# Patient Record
Sex: Female | Born: 1935 | Race: Black or African American | Hispanic: No | Marital: Married | Smoking: Former smoker
Health system: Southern US, Community
[De-identification: ages and names within clinical notes are randomized; demographics above are authoritative.]

## PROBLEM LIST (undated history)

## (undated) DIAGNOSIS — H269 Unspecified cataract: Secondary | ICD-10-CM

## (undated) DIAGNOSIS — I10 Essential (primary) hypertension: Secondary | ICD-10-CM

## (undated) DIAGNOSIS — E079 Disorder of thyroid, unspecified: Secondary | ICD-10-CM

## (undated) DIAGNOSIS — R011 Cardiac murmur, unspecified: Secondary | ICD-10-CM

## (undated) DIAGNOSIS — N189 Chronic kidney disease, unspecified: Secondary | ICD-10-CM

## (undated) DIAGNOSIS — M1990 Unspecified osteoarthritis, unspecified site: Secondary | ICD-10-CM

## (undated) DIAGNOSIS — D649 Anemia, unspecified: Secondary | ICD-10-CM

## (undated) DIAGNOSIS — M810 Age-related osteoporosis without current pathological fracture: Secondary | ICD-10-CM

## (undated) HISTORY — DX: Cardiac murmur, unspecified: R01.1

## (undated) HISTORY — PX: REPLACEMENT TOTAL KNEE BILATERAL: SUR1225

## (undated) HISTORY — DX: Anemia, unspecified: D64.9

## (undated) HISTORY — DX: Unspecified cataract: H26.9

## (undated) HISTORY — PX: JOINT REPLACEMENT: SHX530

## (undated) HISTORY — DX: Age-related osteoporosis without current pathological fracture: M81.0

## (undated) HISTORY — PX: SPINE SURGERY: SHX786

## (undated) HISTORY — PX: BACK SURGERY: SHX140

## (undated) HISTORY — DX: Chronic kidney disease, unspecified: N18.9

## (undated) HISTORY — DX: Disorder of thyroid, unspecified: E07.9

## (undated) HISTORY — PX: EYE SURGERY: SHX253

---

## 2006-07-04 ENCOUNTER — Ambulatory Visit (HOSPITAL_COMMUNITY): Admission: RE | Admit: 2006-07-04 | Discharge: 2006-07-04 | Payer: Self-pay | Admitting: Family Medicine

## 2007-05-30 ENCOUNTER — Ambulatory Visit (HOSPITAL_COMMUNITY): Admission: RE | Admit: 2007-05-30 | Discharge: 2007-05-30 | Payer: Self-pay | Admitting: Family Medicine

## 2007-06-24 ENCOUNTER — Emergency Department (HOSPITAL_COMMUNITY): Admission: EM | Admit: 2007-06-24 | Discharge: 2007-06-24 | Payer: Self-pay | Admitting: Emergency Medicine

## 2007-08-06 ENCOUNTER — Encounter (HOSPITAL_COMMUNITY): Admission: RE | Admit: 2007-08-06 | Discharge: 2007-09-05 | Payer: Self-pay | Admitting: Orthopaedic Surgery

## 2007-09-24 ENCOUNTER — Inpatient Hospital Stay (HOSPITAL_COMMUNITY): Admission: RE | Admit: 2007-09-24 | Discharge: 2007-09-26 | Payer: Self-pay | Admitting: Neurosurgery

## 2007-12-16 ENCOUNTER — Encounter (HOSPITAL_COMMUNITY): Admission: RE | Admit: 2007-12-16 | Discharge: 2008-01-15 | Payer: Self-pay | Admitting: Neurosurgery

## 2008-02-01 ENCOUNTER — Emergency Department (HOSPITAL_COMMUNITY): Admission: EM | Admit: 2008-02-01 | Discharge: 2008-02-01 | Payer: Self-pay | Admitting: Emergency Medicine

## 2008-06-02 ENCOUNTER — Ambulatory Visit (HOSPITAL_COMMUNITY): Admission: RE | Admit: 2008-06-02 | Discharge: 2008-06-02 | Payer: Self-pay | Admitting: Internal Medicine

## 2008-06-02 ENCOUNTER — Encounter: Payer: Self-pay | Admitting: Orthopedic Surgery

## 2008-06-09 ENCOUNTER — Ambulatory Visit: Payer: Self-pay | Admitting: Orthopedic Surgery

## 2008-06-09 DIAGNOSIS — M7580 Other shoulder lesions, unspecified shoulder: Secondary | ICD-10-CM

## 2008-06-09 DIAGNOSIS — M25519 Pain in unspecified shoulder: Secondary | ICD-10-CM | POA: Insufficient documentation

## 2008-06-17 ENCOUNTER — Encounter: Payer: Self-pay | Admitting: Orthopedic Surgery

## 2008-07-22 ENCOUNTER — Ambulatory Visit: Payer: Self-pay | Admitting: Orthopedic Surgery

## 2008-09-16 ENCOUNTER — Ambulatory Visit: Payer: Self-pay | Admitting: Orthopedic Surgery

## 2008-12-30 ENCOUNTER — Emergency Department (HOSPITAL_COMMUNITY): Admission: EM | Admit: 2008-12-30 | Discharge: 2008-12-30 | Payer: Self-pay | Admitting: Emergency Medicine

## 2009-03-09 ENCOUNTER — Encounter: Payer: Self-pay | Admitting: Internal Medicine

## 2009-03-11 ENCOUNTER — Encounter: Payer: Self-pay | Admitting: Internal Medicine

## 2009-03-17 ENCOUNTER — Encounter: Payer: Self-pay | Admitting: Gastroenterology

## 2009-03-18 ENCOUNTER — Encounter: Payer: Self-pay | Admitting: Gastroenterology

## 2009-03-22 ENCOUNTER — Ambulatory Visit (HOSPITAL_COMMUNITY): Admission: RE | Admit: 2009-03-22 | Discharge: 2009-03-22 | Payer: Self-pay | Admitting: Internal Medicine

## 2009-03-22 ENCOUNTER — Ambulatory Visit: Payer: Self-pay | Admitting: Internal Medicine

## 2009-03-23 ENCOUNTER — Encounter: Payer: Self-pay | Admitting: Internal Medicine

## 2009-04-19 ENCOUNTER — Encounter
Admission: RE | Admit: 2009-04-19 | Discharge: 2009-06-16 | Payer: Self-pay | Admitting: Physical Medicine & Rehabilitation

## 2009-05-25 ENCOUNTER — Ambulatory Visit (HOSPITAL_COMMUNITY): Admission: RE | Admit: 2009-05-25 | Discharge: 2009-05-25 | Payer: Self-pay | Admitting: Ophthalmology

## 2009-05-28 ENCOUNTER — Ambulatory Visit: Payer: Self-pay | Admitting: Physical Medicine & Rehabilitation

## 2009-06-29 ENCOUNTER — Ambulatory Visit (HOSPITAL_COMMUNITY): Admission: RE | Admit: 2009-06-29 | Discharge: 2009-06-29 | Payer: Self-pay | Admitting: Ophthalmology

## 2009-07-02 ENCOUNTER — Encounter
Admission: RE | Admit: 2009-07-02 | Discharge: 2009-09-30 | Payer: Self-pay | Admitting: Physical Medicine & Rehabilitation

## 2009-07-02 ENCOUNTER — Ambulatory Visit: Payer: Self-pay | Admitting: Physical Medicine & Rehabilitation

## 2009-07-29 ENCOUNTER — Ambulatory Visit: Payer: Self-pay | Admitting: Physical Medicine & Rehabilitation

## 2009-08-26 ENCOUNTER — Ambulatory Visit: Payer: Self-pay | Admitting: Physical Medicine & Rehabilitation

## 2009-12-23 ENCOUNTER — Emergency Department (HOSPITAL_COMMUNITY): Admission: EM | Admit: 2009-12-23 | Discharge: 2009-12-23 | Payer: Self-pay | Admitting: Emergency Medicine

## 2010-01-26 ENCOUNTER — Encounter
Admission: RE | Admit: 2010-01-26 | Discharge: 2010-04-05 | Payer: Self-pay | Source: Home / Self Care | Attending: Physical Medicine & Rehabilitation | Admitting: Physical Medicine & Rehabilitation

## 2010-04-07 NOTE — Letter (Signed)
Summary: Internal Other Domingo Dimes  Internal Other Domingo Dimes   Imported By: Cloria Spring LPN 16/12/9602 54:09:81  _____________________________________________________________________  External Attachment:    Type:   Image     Comment:   External Document

## 2010-04-07 NOTE — Letter (Signed)
Summary: INS AUT FORM  INS AUT FORM   Imported By: Ave Filter 03/23/2009 12:43:27  _____________________________________________________________________  External Attachment:    Type:   Image     Comment:   External Document

## 2010-04-07 NOTE — Letter (Signed)
Summary: Internal Other Domingo Dimes  Internal Other Domingo Dimes   Imported By: Cloria Spring LPN 04/54/0981 19:14:78  _____________________________________________________________________  External Attachment:    Type:   Image     Comment:   External Document

## 2010-04-07 NOTE — Medication Information (Signed)
Summary: Tax adviser   Imported By: Diana Eves 03/17/2009 10:26:40  _____________________________________________________________________  External Attachment:    Type:   Image     Comment:   External Document  Appended Document: RX Folder peg 4000cc     Please call in Peg [phone] with flavor pack solution, 4000cc, Orfs. Use as directed.  Appended Document: RX Folder Called to [place] @ [facility].

## 2010-04-07 NOTE — Medication Information (Signed)
Summary: Tax adviser   Imported By: Ricard Dillon 03/18/2009 11:56:03  _____________________________________________________________________  External Attachment:    Type:   Image     Comment:   External Document  Appended Document: RX Folder duplicate

## 2010-05-29 LAB — BASIC METABOLIC PANEL
BUN: 16 mg/dL (ref 6–23)
Chloride: 106 mEq/L (ref 96–112)
GFR calc non Af Amer: 56 mL/min — ABNORMAL LOW (ref >60)

## 2010-06-09 LAB — URINALYSIS, ROUTINE W REFLEX MICROSCOPIC
Hgb urine dipstick: NEGATIVE
Nitrite: NEGATIVE
Specific Gravity, Urine: 1.015 (ref 1.005–1.030)
pH: 6 (ref 5.0–8.0)

## 2010-06-09 LAB — URINE CULTURE

## 2010-06-09 LAB — DIFFERENTIAL
Basophils Absolute: 0 10*3/uL (ref 0.0–0.1)
Lymphs Abs: 1.4 10*3/uL (ref 0.7–4.0)
Monocytes Relative: 7 % (ref 3–12)
Neutro Abs: 3 10*3/uL (ref 1.7–7.7)
Neutrophils Relative %: 60 % (ref 43–77)

## 2010-06-09 LAB — CBC
HCT: 33.6 % — ABNORMAL LOW (ref 36.0–46.0)
MCHC: 34.2 g/dL (ref 30.0–36.0)
RBC: 3.71 MIL/uL — ABNORMAL LOW (ref 3.87–5.11)
WBC: 5 10*3/uL (ref 4.0–10.5)

## 2010-06-09 LAB — BASIC METABOLIC PANEL
BUN: 8 mg/dL (ref 6–23)
Calcium: 9.9 mg/dL (ref 8.4–10.5)
Chloride: 102 mEq/L (ref 96–112)
GFR calc non Af Amer: >60 mL/min (ref >60)
Glucose, Bld: 110 mg/dL — ABNORMAL HIGH (ref 70–99)

## 2010-06-09 LAB — POCT CARDIAC MARKERS
Myoglobin, poc: 88.3 ng/mL (ref 12–200)
Troponin i, poc: <0.05 ng/mL (ref 0.00–0.09)

## 2010-07-19 NOTE — Op Note (Signed)
Jeanette Brady, Jeanette Brady                ACCOUNT NO.:  [id number]   MEDICAL RECORD NO.:  [id number]          PATIENT TYPE:  INP   LOCATION:  3023                         FACILITY:  [facility]   PHYSICIAN:  Hilda Lias, M.D.   DATE OF BIRTH:  1930/02/02   DATE OF PROCEDURE:  09/24/2007  DATE OF DISCHARGE:                               OPERATIVE REPORT   PREOPERATIVE DIAGNOSIS:  L4-L5 herniated disk with intraforaminal  compromise stenosis at L3-L4 and mild L5-S1.   POSTOPERATIVE DIAGNOSES:  L4-5 herniated disk with intraforaminal  compromise stenosis at L3-4 and mild L5-S1.   PROCEDURES:  1. Left L4 hemilaminectomy.  2. Left L4-L5 diskectomy.  3. Decompression of the L4-L5 nerve root.  4. Microscope.   SURGICAL PROCEDURES:  Hilda Lias, MD.   ASSISTANT:  Clydene Fake, MD   [name] is a lady who came to my office complaining of back pain  radiating to the left leg.  She has a severe degenerative disk disease  with spondylolisthesis with a herniated disk at the level of L4-L5 to  the left.  She has failed with conservative treatment.  This morning  before surgery, she was complaining of quite a bit of pain in the left  acromioclavicular joint.  There was no evidence of infection.  After the  surgery, I would be doing infiltration on the left shoulder.   DESCRIPTION OF PROCEDURE:  The patient was taken to the OR and she was  positioned in a prone manner.  The back was cleaned with DuraPrep and  midline incision.  After we did a x-ray which showed that we were at the  level of L4.  The incision was carried out through the skin,  subcutaneous tissue to a thick adipose tissue down to the spinous  process.  We found interlaminar space between L4-L5.  There was almost  no space whatsoever.  Because of that, we went ahead and we did a  hemilaminectomy of L4.  A thick, yellow ligament was also excised.  We  found the thecal sac and retraction was made.  Indeed, there was a  herniated disk mostly going into the foramen compromising the L4 nerve  root.  Incision was made and two large fragments were removed.  Then  foraminotomy was accomplished and at the end, we have a good  decompression of the L4 and L5 nerve root.  Fentanyl and Depo-Medrol  were left in the epidural space and the wound was closed with Vicryl and  Steri-Strips.  Immediately after surgery, I cleaned the left shoulder  with DuraPrep.  Using 4 mg of Kenalog, infiltration of the  acromioclavicular joint was done to infiltrate the area.  The patient is  going to go to the recovery room and I will follow her.          ______________________________  Hilda Lias, M.D.    EB/MEDQ  D:  09/24/2007  T:  09/25/2007  Job:  [id number]

## 2010-12-02 LAB — CBC
MCHC: 32.8
Platelets: 232
RDW: 15.1
WBC: 6.2

## 2010-12-02 LAB — BASIC METABOLIC PANEL
BUN: 11
CO2: 23
Chloride: 103
Creatinine, Ser: 0.91
GFR calc Af Amer: >60
GFR calc non Af Amer: >60
Potassium: 3.6

## 2013-09-08 ENCOUNTER — Emergency Department (HOSPITAL_COMMUNITY)
Admission: EM | Admit: 2013-09-08 | Discharge: 2013-09-08 | Disposition: A | Discharge status: Home / Self Care | Payer: Medicare HMO | Attending: Emergency Medicine | Admitting: Emergency Medicine

## 2013-09-08 ENCOUNTER — Emergency Department (HOSPITAL_COMMUNITY): Payer: Medicare HMO

## 2013-09-08 ENCOUNTER — Encounter (HOSPITAL_COMMUNITY): Payer: Self-pay | Admitting: Emergency Medicine

## 2013-09-08 DIAGNOSIS — M79641 Pain in right hand: Secondary | ICD-10-CM

## 2013-09-08 DIAGNOSIS — M1990 Unspecified osteoarthritis, unspecified site: Secondary | ICD-10-CM

## 2013-09-08 DIAGNOSIS — Z87891 Personal history of nicotine dependence: Secondary | ICD-10-CM | POA: Insufficient documentation

## 2013-09-08 DIAGNOSIS — M19049 Primary osteoarthritis, unspecified hand: Secondary | ICD-10-CM | POA: Insufficient documentation

## 2013-09-08 HISTORY — DX: Unspecified osteoarthritis, unspecified site: M19.90

## 2013-09-08 MED ORDER — PREDNISONE 10 MG PO TABS
10.0000 mg | ORAL_TABLET | Freq: Once | ORAL | Status: AC
Start: 2013-09-08 — End: 2013-09-08
  Administered 2013-09-08: 10 mg via ORAL
  Filled 2013-09-08: qty 1

## 2013-09-08 MED ORDER — PREDNISONE 10 MG PO TABS: 10.0000 mg | ORAL_TABLET | Freq: Every day | ORAL | Status: DC

## 2013-09-08 NOTE — ED Notes (Signed)
Pt returned from xray with tech

## 2013-09-08 NOTE — ED Provider Notes (Signed)
CSN: [id number]     Arrival date & time 09/08/13  4098 History   First MD Initiated Contact with Patient 09/08/13 0802     Chief Complaint  Patient presents with  . Hand Pain     (Consider location/radiation/quality/duration/timing/severity/associated sxs/prior Treatment) Patient is a 80 y.o. female presenting with hand pain. The history is provided by the patient.  Hand Pain This is a new problem. The current episode started 1 to 4 weeks ago. The problem occurs constantly. The problem has been gradually worsening. She has tried nothing for the symptoms.   Jeanette Brady is a 80 y.o. female who presents to the ED with right hand pain that started 2 weeks ago. She is right hand dominant and has trouble eating due to the pain. The pain is severe.  She has occasional pain that starts in the right shoulder that radiates to the hand causing severe pain, but even when the shoulder pain is not there she has severe pain in the joints of her fingers. She also has pain that comes and goes in the left lower leg when ambulating. PMH significant for arthritis. She has had bilateral knee replacements and back surgery. She has tried aleve for pain.   Past Medical History  Diagnosis Date  . Arthritis    Past Surgical History  Procedure Laterality Date  . Back surgery    . Replacement total knee bilateral     History reviewed. No pertinent family history. History  Substance Use Topics  . Smoking status: Former Research scientist (life sciences)  . Smokeless tobacco: Not on file  . Alcohol Use: No   OB History   Grav Para Term Preterm Abortions TAB SAB Ect Mult Living                 Review of Systems Negative except as stated in HPI   Allergies  Review of patient's allergies indicates not on file.  Home Medications   Prior to Admission medications   Not on File   BP 177/58  Pulse 78  Temp(Src) 97.7 F (36.5 C) (Oral)  Resp 18  Ht 5\' 5"  (1.651 m)  Wt 156 lb (70.761 kg)  BMI 25.96 kg/m2  SpO2 98% Physical  Exam  Nursing note and vitals reviewed. Constitutional: She is oriented to person, place, and time. She appears well-developed and well-nourished. No distress.  HENT:  Head: Normocephalic.  Eyes: EOM are normal.  Neck: Neck supple.  Cardiovascular: Normal rate.   Pulmonary/Chest: Effort normal.  Musculoskeletal:       Right hand: She exhibits tenderness and bony tenderness. She exhibits normal capillary refill, no deformity and no laceration. Normal sensation noted. Decreased strength noted.  Tender over joints of fingers. Decreased grip due to pain. Radial pulse strong, adequate circulation. Good touch sensation. Full range of motion of right shoulder without difficulty or pain.   Neurological: She is alert and oriented to person, place, and time. No cranial nerve deficit.  Skin: Skin is warm and dry.  Psychiatric: She has a normal mood and affect. Her behavior is normal.   Dg Hand Complete Right  09/08/2013   CLINICAL DATA:  Diffuse right hand pain for 2-3 weeks, worsening over the past week.  EXAM: RIGHT HAND - COMPLETE 3+ VIEW  COMPARISON:  None.  FINDINGS: There is no acute bony or joint abnormality. Marked joint space narrowing and osteophytosis are seen about the first and second MCP joints. There is also some joint space narrowing and osteophytosis about the first  CMC joint. Soft tissue structures are unremarkable.  IMPRESSION: No acute finding.  Advanced degenerative disease first and second MCP joints.   Electronically Signed   By: Inge Rise M.D.   On: 09/08/2013 08:34    ED Course: Dr. Lacinda Axon in to see the patient  Procedures  MDM  80 y.o. female with right hand pain x 2 weeks with history of arthritis. I have reviewed this patient's vital signs, nurses notes, appropriate imaging and discussed findings with the patient and plan of care. She voices understanding. She will follow up with her PCP or return here as needed.     Medication List         predniSONE 10 MG tablet   Commonly known as:  DELTASONE  Take 1 tablet (10 mg total) by mouth daily with breakfast.            Ashley Murrain, NP 09/08/13 [phone]

## 2013-09-08 NOTE — ED Notes (Signed)
Pt a&o, nad, right wrist splint in place. Pt states "that already makes my wrist feel better". Instructions on wrist splint given. Sensation intact, positive pulses. F/u reviewed with patient.

## 2013-09-08 NOTE — ED Notes (Signed)
Pt reports right hand pain, left leg pain for several days. Pt denies any known injury. No obvious deformities noted. nad noted.

## 2013-09-08 NOTE — Discharge Instructions (Signed)
Take tylenol and the medication we give you. Follow up with Dr. Legrand Rams. Wear the splint for comfort. Return as needed.

## 2013-09-09 NOTE — ED Provider Notes (Signed)
Medical screening examination/treatment/procedure(s) were conducted as a shared visit with non-physician practitioner(s) and myself.  I personally evaluated the patient during the encounter.   EKG Interpretation None     No acute neurovascular problem.  Rx for arthritic pain  Nat Christen, MD 09/09/13 [phone]

## 2014-01-09 ENCOUNTER — Emergency Department (HOSPITAL_COMMUNITY)
Admission: EM | Admit: 2014-01-09 | Discharge: 2014-01-09 | Disposition: A | Discharge status: Home / Self Care | Payer: Medicare HMO | Attending: Emergency Medicine | Admitting: Emergency Medicine

## 2014-01-09 ENCOUNTER — Encounter (HOSPITAL_COMMUNITY): Payer: Self-pay | Admitting: Emergency Medicine

## 2014-01-09 ENCOUNTER — Emergency Department (HOSPITAL_COMMUNITY): Payer: Medicare HMO

## 2014-01-09 DIAGNOSIS — M6281 Muscle weakness (generalized): Secondary | ICD-10-CM | POA: Insufficient documentation

## 2014-01-09 DIAGNOSIS — Z79899 Other long term (current) drug therapy: Secondary | ICD-10-CM | POA: Insufficient documentation

## 2014-01-09 DIAGNOSIS — Y998 Other external cause status: Secondary | ICD-10-CM | POA: Insufficient documentation

## 2014-01-09 DIAGNOSIS — W01190A Fall on same level from slipping, tripping and stumbling with subsequent striking against furniture, initial encounter: Secondary | ICD-10-CM | POA: Insufficient documentation

## 2014-01-09 DIAGNOSIS — Z7952 Long term (current) use of systemic steroids: Secondary | ICD-10-CM | POA: Insufficient documentation

## 2014-01-09 DIAGNOSIS — S7002XA Contusion of left hip, initial encounter: Secondary | ICD-10-CM | POA: Diagnosis not present

## 2014-01-09 DIAGNOSIS — Y92000 Kitchen of unspecified non-institutional (private) residence as  the place of occurrence of the external cause: Secondary | ICD-10-CM | POA: Insufficient documentation

## 2014-01-09 DIAGNOSIS — Z7982 Long term (current) use of aspirin: Secondary | ICD-10-CM | POA: Diagnosis not present

## 2014-01-09 DIAGNOSIS — M1990 Unspecified osteoarthritis, unspecified site: Secondary | ICD-10-CM | POA: Insufficient documentation

## 2014-01-09 DIAGNOSIS — Y9389 Activity, other specified: Secondary | ICD-10-CM | POA: Diagnosis not present

## 2014-01-09 DIAGNOSIS — I10 Essential (primary) hypertension: Secondary | ICD-10-CM | POA: Diagnosis not present

## 2014-01-09 DIAGNOSIS — S79912A Unspecified injury of left hip, initial encounter: Secondary | ICD-10-CM | POA: Diagnosis present

## 2014-01-09 DIAGNOSIS — Z87891 Personal history of nicotine dependence: Secondary | ICD-10-CM | POA: Diagnosis not present

## 2014-01-09 DIAGNOSIS — Z96653 Presence of artificial knee joint, bilateral: Secondary | ICD-10-CM | POA: Diagnosis not present

## 2014-01-09 DIAGNOSIS — W19XXXA Unspecified fall, initial encounter: Secondary | ICD-10-CM

## 2014-01-09 HISTORY — DX: Essential (primary) hypertension: I10

## 2014-01-09 LAB — URINALYSIS, ROUTINE W REFLEX MICROSCOPIC
Bilirubin Urine: NEGATIVE
GLUCOSE, UA: NEGATIVE mg/dL
Hgb urine dipstick: NEGATIVE
Ketones, ur: NEGATIVE mg/dL
Leukocytes, UA: NEGATIVE
NITRITE: NEGATIVE
Protein, ur: NEGATIVE mg/dL
UROBILINOGEN UA: 0.2 mg/dL (ref 0.0–1.0)
pH: 6 (ref 5.0–8.0)

## 2014-01-09 NOTE — ED Notes (Signed)
PT stated she fell x3 days ago in her kitchen and hit onto her left hip. PT c/o hip pain, generalized weakness and difficulty walking.

## 2014-01-09 NOTE — ED Notes (Signed)
Patient ambulated in hallway independently with steady gait using cane.

## 2014-01-09 NOTE — ED Provider Notes (Signed)
Patient reports she fell 2 days ago at home injuring her left hip. No other injury. Pain is worse with walking improved with remaining still. She denies generalized weakness she does report "my legs up and wobbly for the past 4 months." She walks with a cane.she has been ambulatory since the fall On exam she is alert no distress Glasgow Coma Score 15 HEENT exam conjunctiva pink pelvis is stable. Left lower extremity mild tenderness over hip. No pain on internal or external rotation of thigh. No deformity. DP pulse 2+. All other extremities without contusion abrasion or tenderness neurovascular intact. Pretest clinical suspicion for fracture is low  Orlie Dakin, MD 01/09/14 [phone]

## 2014-01-15 NOTE — ED Provider Notes (Signed)
CSN: [id number]     Arrival date & time 01/09/14  0820 History   First MD Initiated Contact with Patient 01/09/14 0857     Chief Complaint  Patient presents with  . Hip Pain     (Consider location/radiation/quality/duration/timing/severity/associated sxs/prior Treatment) HPI   Jeanette Brady is a 80 y.o. female who presents to the Emergency Department complaining of left hip pain after falling against her kitchen table.  She reports striking her left hip. She c/o pain with walking.  She has been taking ibuprofen with significant relief.  She denies head injury, neck or back pain, LOC, incontinence of bladder or bowel, saddle anesthesia's, vomiting or fever.  She also reports generalized weakness of both lower extremities, but states this is a chronic problem and she has been walking with a cane for some time.      Past Medical History  Diagnosis Date  . Arthritis   . Hypertension    Past Surgical History  Procedure Laterality Date  . Back surgery    . Replacement total knee bilateral     No family history on file. History  Substance Use Topics  . Smoking status: Former Research scientist (life sciences)  . Smokeless tobacco: Current User     Comment: snuff  . Alcohol Use: No   OB History    No data available     Review of Systems  Constitutional: Negative for fever and chills.  Respiratory: Negative for shortness of breath.   Cardiovascular: Negative for chest pain.  Gastrointestinal: Negative for nausea, vomiting and abdominal pain.  Genitourinary: Negative for dysuria, hematuria and difficulty urinating.  Musculoskeletal: Positive for arthralgias. Negative for back pain, joint swelling and neck pain.  Skin: Negative for color change and wound.  Neurological: Negative for dizziness, syncope, light-headedness, numbness and headaches.       Generalized weakness of both legs  All other systems reviewed and are negative.     Allergies  Review of patient's allergies indicates no known  allergies.  Home Medications   Prior to Admission medications   Medication Sig Start Date End Date Taking? Authorizing Provider  aspirin EC 81 MG tablet Take 81 mg by mouth daily.   Yes Historical Provider, MD  Aspirin-Acetaminophen-Caffeine (GOODY HEADACHE PO) Take 1 Package by mouth daily as needed (headache).   Yes Historical Provider, MD  gabapentin (NEURONTIN) 300 MG capsule Take 1 capsule by mouth 3 (three) times daily. 12/09/13  Yes Historical Provider, MD  ibuprofen (ADVIL,MOTRIN) 200 MG tablet Take 400 mg by mouth every 6 (six) hours as needed for moderate pain.   Yes Historical Provider, MD  losartan-hydrochlorothiazide (HYZAAR) 100-12.5 MG per tablet Take 1 tablet by mouth daily. 12/08/13  Yes Historical Provider, MD  SYNTHROID 100 MCG tablet Take 1 tablet by mouth daily. 11/29/13  Yes Historical Provider, MD  predniSONE (DELTASONE) 10 MG tablet Take 1 tablet (10 mg total) by mouth daily with breakfast. Patient not taking: Reported on 01/09/2014 09/08/13   Ashley Murrain, NP   BP 181/61 mmHg  Pulse 76  Temp(Src) 99 F (37.2 C) (Oral)  Resp 16  Ht 5\' 2"  (1.575 m)  Wt 156 lb (70.761 kg)  BMI 28.53 kg/m2  SpO2 100% Physical Exam  Constitutional: She is oriented to person, place, and time. She appears well-developed and well-nourished. No distress.  HENT:  Head: Normocephalic and atraumatic.  Neck: Normal range of motion. Neck supple.  Cardiovascular: Normal rate, regular rhythm, normal heart sounds and intact distal pulses.   No  murmur heard. Pulmonary/Chest: Effort normal and breath sounds normal. No respiratory distress.  Abdominal: Soft. She exhibits no distension. There is no tenderness. There is no rebound and no guarding.  Musculoskeletal: She exhibits tenderness. She exhibits no edema.       Lumbar back: She exhibits tenderness and pain. She exhibits normal range of motion, no swelling, no deformity, no laceration and normal pulse.  ttp of the left lateral and posterior hip.   No spinal tenderness.  Pelvis is NT.  No bony deformity, edema bruising or abrasion.  DP pulses are brisk and symmetrical.  Distal sensation intact.  Hip Flexors/Extensors are intact.    Neurological: She is alert and oriented to person, place, and time. She has normal strength. No sensory deficit. She exhibits normal muscle tone. Coordination and gait normal.  Reflex Scores:      Patellar reflexes are 2+ on the right side and 2+ on the left side.      Achilles reflexes are 2+ on the right side and 2+ on the left side. Skin: Skin is warm and dry. No rash noted.  Psychiatric: She has a normal mood and affect. Her behavior is normal. Thought content normal.  Nursing note and vitals reviewed.   ED Course  Procedures (including critical care time) Labs Review Labs Reviewed  URINALYSIS, ROUTINE W REFLEX MICROSCOPIC - Abnormal; Notable for the following:    Specific Gravity, Urine <1.005 (*)    All other components within normal limits    Imaging Review Dg Hip Complete Left  01/09/2014   CLINICAL DATA:  Hip pain  EXAM: LEFT HIP - COMPLETE 2+ VIEW  COMPARISON:  06/24/2007  FINDINGS: Negative for fracture or AVN. Hip joint is normal. No change from the prior study. Arterial calcification is noted.  IMPRESSION: Negative.   Electronically Signed   By: Franchot Gallo M.D.   On: 01/09/2014 10:38     EKG Interpretation None      MDM   Final diagnoses:  Fall  Contusion, hip, left, initial encounter    Pt is well appearing, ambulates with a cane at baseline.  Has tenderness of the left hip without bony deformity.  Clinical suspicion for fx is low, XR neg.  No concerning sx's for septic joint.  Pain controlled with ibuprofen.  Pt agrees to close f/u with her PMD or to return here if the pain worsens.    Pt also seen by Dr. Winfred Leeds and care plan discussed.      Donterius Filley L. Ammie Ferrier 01/15/14 2001  Orlie Dakin, MD 01/17/14 2831

## 2014-01-25 ENCOUNTER — Emergency Department (HOSPITAL_COMMUNITY)
Admission: EM | Admit: 2014-01-25 | Discharge: 2014-01-25 | Disposition: A | Discharge status: Home / Self Care | Payer: Medicare HMO | Attending: Emergency Medicine | Admitting: Emergency Medicine

## 2014-01-25 ENCOUNTER — Encounter (HOSPITAL_COMMUNITY): Payer: Self-pay | Admitting: Emergency Medicine

## 2014-01-25 DIAGNOSIS — F419 Anxiety disorder, unspecified: Secondary | ICD-10-CM | POA: Diagnosis not present

## 2014-01-25 DIAGNOSIS — R251 Tremor, unspecified: Secondary | ICD-10-CM | POA: Diagnosis present

## 2014-01-25 DIAGNOSIS — Z87891 Personal history of nicotine dependence: Secondary | ICD-10-CM | POA: Diagnosis not present

## 2014-01-25 DIAGNOSIS — M1990 Unspecified osteoarthritis, unspecified site: Secondary | ICD-10-CM | POA: Diagnosis not present

## 2014-01-25 DIAGNOSIS — R531 Weakness: Secondary | ICD-10-CM | POA: Insufficient documentation

## 2014-01-25 DIAGNOSIS — Z79899 Other long term (current) drug therapy: Secondary | ICD-10-CM | POA: Diagnosis not present

## 2014-01-25 DIAGNOSIS — I10 Essential (primary) hypertension: Secondary | ICD-10-CM | POA: Insufficient documentation

## 2014-01-25 DIAGNOSIS — E039 Hypothyroidism, unspecified: Secondary | ICD-10-CM | POA: Insufficient documentation

## 2014-01-25 DIAGNOSIS — Z7982 Long term (current) use of aspirin: Secondary | ICD-10-CM | POA: Insufficient documentation

## 2014-01-25 LAB — CBC WITH DIFFERENTIAL/PLATELET
BASOS ABS: 0.1 10*3/uL (ref 0.0–0.1)
BASOS PCT: 1 % (ref 0–1)
EOS ABS: 0.2 10*3/uL (ref 0.0–0.7)
EOS PCT: 3 % (ref 0–5)
HEMATOCRIT: 32.1 % — AB (ref 36.0–46.0)
Hemoglobin: 10.8 g/dL — ABNORMAL LOW (ref 12.0–15.0)
Lymphocytes Relative: 20 % (ref 12–46)
Lymphs Abs: 1.7 10*3/uL (ref 0.7–4.0)
MCH: 29.7 pg (ref 26.0–34.0)
MCHC: 33.6 g/dL (ref 30.0–36.0)
MCV: 88.2 fL (ref 78.0–100.0)
MONO ABS: 0.6 10*3/uL (ref 0.1–1.0)
Monocytes Relative: 8 % (ref 3–12)
Neutro Abs: 5.6 10*3/uL (ref 1.7–7.7)
Neutrophils Relative %: 69 % (ref 43–77)
Platelets: 350 10*3/uL (ref 150–400)
RBC: 3.64 MIL/uL — ABNORMAL LOW (ref 3.87–5.11)
RDW: 12.5 % (ref 11.5–15.5)
WBC: 8.1 10*3/uL (ref 4.0–10.5)

## 2014-01-25 LAB — URINALYSIS, ROUTINE W REFLEX MICROSCOPIC
Bilirubin Urine: NEGATIVE
GLUCOSE, UA: NEGATIVE mg/dL
HGB URINE DIPSTICK: NEGATIVE
KETONES UR: NEGATIVE mg/dL
Nitrite: NEGATIVE
PH: 6 (ref 5.0–8.0)
Protein, ur: NEGATIVE mg/dL
Specific Gravity, Urine: 1.02 (ref 1.005–1.030)
Urobilinogen, UA: 0.2 mg/dL (ref 0.0–1.0)

## 2014-01-25 LAB — URINE MICROSCOPIC-ADD ON

## 2014-01-25 LAB — COMPREHENSIVE METABOLIC PANEL
ALBUMIN: 3.1 g/dL — AB (ref 3.5–5.2)
ALK PHOS: 56 U/L (ref 39–117)
ALT: 5 U/L (ref 0–35)
ANION GAP: 12 (ref 5–15)
AST: 10 U/L (ref 0–37)
BILIRUBIN TOTAL: 0.2 mg/dL — AB (ref 0.3–1.2)
BUN: 20 mg/dL (ref 6–23)
CHLORIDE: 98 meq/L (ref 96–112)
CO2: 24 mEq/L (ref 19–32)
Calcium: 9.8 mg/dL (ref 8.4–10.5)
Creatinine, Ser: 1.2 mg/dL — ABNORMAL HIGH (ref 0.50–1.10)
GFR calc Af Amer: 47 mL/min — ABNORMAL LOW (ref >90)
GFR calc non Af Amer: 41 mL/min — ABNORMAL LOW (ref >90)
Glucose, Bld: 108 mg/dL — ABNORMAL HIGH (ref 70–99)
POTASSIUM: 4.1 meq/L (ref 3.7–5.3)
Sodium: 134 mEq/L — ABNORMAL LOW (ref 137–147)
Total Protein: 6.5 g/dL (ref 6.0–8.3)

## 2014-01-25 LAB — CBG MONITORING, ED: Glucose-Capillary: 88 mg/dL (ref 70–99)

## 2014-01-25 LAB — TROPONIN I: Troponin I: <0.3 ng/mL (ref <0.30)

## 2014-01-25 NOTE — ED Notes (Signed)
EDP at bedside  

## 2014-01-25 NOTE — Discharge Instructions (Signed)
Follow-up with your primary Dr. in the next few days if symptoms do not resolve, and return to the ER if your symptoms worsen or change.   Fatigue Fatigue is a feeling of tiredness, lack of energy, lack of motivation, or feeling tired all the time. Having enough rest, good nutrition, and reducing stress will normally reduce fatigue. Consult your caregiver if it persists. The nature of your fatigue will help your caregiver to find out its cause. The treatment is based on the cause.  CAUSES  There are many causes for fatigue. Most of the time, fatigue can be traced to one or more of your habits or routines. Most causes fit into one or more of three general areas. They are: Lifestyle problems  Sleep disturbances.  Overwork.  Physical exertion.  Unhealthy habits.  Poor eating habits or eating disorders.  Alcohol and/or drug use .  Lack of proper nutrition (malnutrition). Psychological problems  Stress and/or anxiety problems.  Depression.  Grief.  Boredom. Medical Problems or Conditions  Anemia.  Pregnancy.  Thyroid gland problems.  Recovery from major surgery.  Continuous pain.  Emphysema or asthma that is not well controlled  Allergic conditions.  Diabetes.  Infections (such as mononucleosis).  Obesity.  Sleep disorders, such as sleep apnea.  Heart failure or other heart-related problems.  Cancer.  Kidney disease.  Liver disease.  Effects of certain medicines such as antihistamines, cough and cold remedies, prescription pain medicines, heart and blood pressure medicines, drugs used for treatment of cancer, and some antidepressants. SYMPTOMS  The symptoms of fatigue include:   Lack of energy.  Lack of drive (motivation).  Drowsiness.  Feeling of indifference to the surroundings. DIAGNOSIS  The details of how you feel help guide your caregiver in finding out what is causing the fatigue. You will be asked about your present and past health  condition. It is important to review all medicines that you take, including prescription and non-prescription items. A thorough exam will be done. You will be questioned about your feelings, habits, and normal lifestyle. Your caregiver may suggest blood tests, urine tests, or other tests to look for common medical causes of fatigue.  TREATMENT  Fatigue is treated by correcting the underlying cause. For example, if you have continuous pain or depression, treating these causes will improve how you feel. Similarly, adjusting the dose of certain medicines will help in reducing fatigue.  HOME CARE INSTRUCTIONS   Try to get the required amount of good sleep every night.  Eat a healthy and nutritious diet, and drink enough water throughout the day.  Practice ways of relaxing (including yoga or meditation).  Exercise regularly.  Make plans to change situations that cause stress. Act on those plans so that stresses decrease over time. Keep your work and personal routine reasonable.  Avoid street drugs and minimize use of alcohol.  Start taking a daily multivitamin after consulting your caregiver. SEEK MEDICAL CARE IF:   You have persistent tiredness, which cannot be accounted for.  You have fever.  You have unintentional weight loss.  You have headaches.  You have disturbed sleep throughout the night.  You are feeling sad.  You have constipation.  You have dry skin.  You have gained weight.  You are taking any new or different medicines that you suspect are causing fatigue.  You are unable to sleep at night.  You develop any unusual swelling of your legs or other parts of your body. SEEK IMMEDIATE MEDICAL CARE IF:  You are feeling confused.  Your vision is blurred.  You feel faint or pass out.  You develop severe headache.  You develop severe abdominal, pelvic, or back pain.  You develop chest pain, shortness of breath, or an irregular or fast heartbeat.  You are  unable to pass a normal amount of urine.  You develop abnormal bleeding such as bleeding from the rectum or you vomit blood.  You have thoughts about harming yourself or committing suicide.  You are worried that you might harm someone else. MAKE SURE YOU:   Understand these instructions.  Will watch your condition.  Will get help right away if you are not doing well or get worse. Document Released: 12/18/2006 Document Revised: 05/15/2011 Document Reviewed: 06/24/2013 [facility] Patient Information 2015 [place], [place]. This information is not intended to replace advice given to you by your health care provider. Make sure you discuss any questions you have with your health care provider.

## 2014-01-25 NOTE — ED Provider Notes (Signed)
CSN: [id number]     Arrival date & time 01/25/14  1539 History   First MD Initiated Contact with Patient 01/25/14 1556     Chief Complaint  Patient presents with  . Tremors  . Anxiety     (Consider location/radiation/quality/duration/timing/severity/associated sxs/prior Treatment) HPI Comments: Patient is an 80 year old female with past medical history of hypertension and hypothyroidism. She presents today with complaints of weakness and shakiness that started this morning. She states she felt a little nervous last night and her symptoms worsened when she woke this morning. She denies any other symptoms. She denies any recent illness.  Patient is a 80 y.o. female presenting with anxiety. The history is provided by the patient.  Anxiety This is a new problem. Episode onset: This morning. The problem occurs constantly. The problem has been gradually worsening. Pertinent negatives include no chest pain and no shortness of breath. Nothing aggravates the symptoms. She has tried nothing for the symptoms. The treatment provided no relief.    Past Medical History  Diagnosis Date  . Arthritis   . Hypertension    Past Surgical History  Procedure Laterality Date  . Back surgery    . Replacement total knee bilateral     Family History  Problem Relation Age of Onset  . Heart attack Mother    History  Substance Use Topics  . Smoking status: Former Smoker -- 0.05 packs/day for 20 years    Types: Cigarettes    Quit date: 03/06/1993  . Smokeless tobacco: Current User    Types: Snuff  . Alcohol Use: No   OB History    Gravida Para Term Preterm AB TAB SAB Ectopic Multiple Living   2 2 2       2      Review of Systems  Constitutional: Positive for fatigue.  Respiratory: Negative for shortness of breath.   Cardiovascular: Negative for chest pain.  All other systems reviewed and are negative.     Allergies  Review of patient's allergies indicates no known allergies.  Home  Medications   Prior to Admission medications   Medication Sig Start Date End Date Taking? Authorizing Provider  aspirin EC 81 MG tablet Take 81 mg by mouth daily.    Historical Provider, MD  Aspirin-Acetaminophen-Caffeine (GOODY HEADACHE PO) Take 1 Package by mouth daily as needed (headache).    Historical Provider, MD  gabapentin (NEURONTIN) 300 MG capsule Take 1 capsule by mouth 3 (three) times daily. 12/09/13   Historical Provider, MD  ibuprofen (ADVIL,MOTRIN) 200 MG tablet Take 400 mg by mouth every 6 (six) hours as needed for moderate pain.    Historical Provider, MD  losartan-hydrochlorothiazide (HYZAAR) 100-12.5 MG per tablet Take 1 tablet by mouth daily. 12/08/13   Historical Provider, MD  predniSONE (DELTASONE) 10 MG tablet Take 1 tablet (10 mg total) by mouth daily with breakfast. Patient not taking: Reported on 01/09/2014 09/08/13   Ashley Murrain, NP  SYNTHROID 100 MCG tablet Take 1 tablet by mouth daily. 11/29/13   Historical Provider, MD   BP 146/59 mmHg  Pulse 74  Temp(Src) 98.1 F (36.7 C) (Oral)  Resp 16  Ht 5\' 3"  (1.6 m)  Wt 151 lb (68.493 kg)  BMI 26.76 kg/m2  SpO2 100% Physical Exam  Constitutional: She is oriented to person, place, and time. She appears well-developed and well-nourished. No distress.  HENT:  Head: Normocephalic and atraumatic.  Eyes: EOM are normal. Pupils are equal, round, and reactive to light.  Neck: Normal range  of motion. Neck supple.  Cardiovascular: Normal rate and regular rhythm.  Exam reveals no gallop and no friction rub.   No murmur heard. Pulmonary/Chest: Effort normal and breath sounds normal. No respiratory distress. She has no wheezes.  Abdominal: Soft. Bowel sounds are normal. She exhibits no distension. There is no tenderness.  Musculoskeletal: Normal range of motion. She exhibits no edema.  Neurological: She is alert and oriented to person, place, and time. No cranial nerve deficit. She exhibits normal muscle tone. Coordination normal.   Skin: Skin is warm and dry. She is not diaphoretic.  Nursing note and vitals reviewed.   ED Course  Procedures (including critical care time) Labs Review Labs Reviewed  COMPREHENSIVE METABOLIC PANEL  CBC WITH DIFFERENTIAL  TROPONIN I  URINALYSIS, ROUTINE W REFLEX MICROSCOPIC  CBG MONITORING, ED    Imaging Review No results found.   EKG Interpretation   Date/Time:  Sunday January 25 2014 16:02:58 EST Ventricular Rate:  73 PR Interval:  162 QRS Duration: 83 QT Interval:  398 QTC Calculation: 439 R Axis:   30 Text Interpretation:  Sinus rhythm Confirmed by Beau Fanny  MD, Rachyl Wuebker ([id number])  on 01/25/2014 4:21:15 PM      MDM   Final diagnoses:  None    Patient is an 80 year old female who presents with complaints of weakness, anxiousness, and tremors she states that started this morning. Her physical examination is unremarkable and laboratory studies and urinalysis are all unremarkable. Her EKG reveals a sinus rhythm with no changes from prior studies. I have discussed the results of these tests with her and agree that she is appropriate for discharge. She understands to return if her symptoms worsen or change.    Veryl Speak, MD 01/25/14 [phone]

## 2014-01-25 NOTE — ED Notes (Signed)
Patient reports feeling "very anxious" with tremors today. When asked about chest pain, family states she was c/o chest pain yesterday. Denies any shortness of breath.

## 2014-03-20 ENCOUNTER — Ambulatory Visit (HOSPITAL_COMMUNITY)
Admission: RE | Admit: 2014-03-20 | Discharge: 2014-03-20 | Disposition: A | Discharge status: Home / Self Care | Payer: Commercial Managed Care - HMO | Source: Ambulatory Visit | Attending: Internal Medicine | Admitting: Internal Medicine

## 2014-03-20 ENCOUNTER — Other Ambulatory Visit (HOSPITAL_COMMUNITY): Payer: Self-pay | Admitting: Internal Medicine

## 2014-03-20 DIAGNOSIS — M25511 Pain in right shoulder: Secondary | ICD-10-CM | POA: Diagnosis present

## 2014-06-16 ENCOUNTER — Emergency Department (HOSPITAL_COMMUNITY)
Admission: EM | Admit: 2014-06-16 | Discharge: 2014-06-17 | Disposition: A | Discharge status: Home / Self Care | Payer: Commercial Managed Care - HMO | Attending: Emergency Medicine | Admitting: Emergency Medicine

## 2014-06-16 ENCOUNTER — Encounter (HOSPITAL_COMMUNITY): Payer: Self-pay

## 2014-06-16 DIAGNOSIS — Z87891 Personal history of nicotine dependence: Secondary | ICD-10-CM | POA: Diagnosis not present

## 2014-06-16 DIAGNOSIS — R2241 Localized swelling, mass and lump, right lower limb: Secondary | ICD-10-CM | POA: Insufficient documentation

## 2014-06-16 DIAGNOSIS — Z7982 Long term (current) use of aspirin: Secondary | ICD-10-CM | POA: Insufficient documentation

## 2014-06-16 DIAGNOSIS — I10 Essential (primary) hypertension: Secondary | ICD-10-CM | POA: Diagnosis not present

## 2014-06-16 DIAGNOSIS — M1990 Unspecified osteoarthritis, unspecified site: Secondary | ICD-10-CM | POA: Insufficient documentation

## 2014-06-16 DIAGNOSIS — Z79899 Other long term (current) drug therapy: Secondary | ICD-10-CM | POA: Insufficient documentation

## 2014-06-16 DIAGNOSIS — F419 Anxiety disorder, unspecified: Secondary | ICD-10-CM | POA: Diagnosis not present

## 2014-06-16 DIAGNOSIS — R112 Nausea with vomiting, unspecified: Secondary | ICD-10-CM | POA: Diagnosis not present

## 2014-06-16 LAB — COMPREHENSIVE METABOLIC PANEL
ALK PHOS: 40 U/L (ref 39–117)
ALT: 7 U/L (ref 0–35)
AST: 16 U/L (ref 0–37)
Albumin: 3.7 g/dL (ref 3.5–5.2)
Anion gap: 7 (ref 5–15)
BUN: 18 mg/dL (ref 6–23)
CALCIUM: 9.8 mg/dL (ref 8.4–10.5)
CO2: 26 mmol/L (ref 19–32)
CREATININE: 0.86 mg/dL (ref 0.50–1.10)
Chloride: 99 mmol/L (ref 96–112)
GFR calc Af Amer: 70 mL/min — ABNORMAL LOW (ref >90)
GFR, EST NON AFRICAN AMERICAN: 61 mL/min — AB (ref >90)
Glucose, Bld: 111 mg/dL — ABNORMAL HIGH (ref 70–99)
Potassium: 3 mmol/L — ABNORMAL LOW (ref 3.5–5.1)
Sodium: 132 mmol/L — ABNORMAL LOW (ref 135–145)
Total Bilirubin: 0.4 mg/dL (ref 0.3–1.2)
Total Protein: 7 g/dL (ref 6.0–8.3)

## 2014-06-16 LAB — CBC WITH DIFFERENTIAL/PLATELET
BASOS ABS: 0.1 10*3/uL (ref 0.0–0.1)
BASOS PCT: 1 % (ref 0–1)
EOS PCT: 1 % (ref 0–5)
Eosinophils Absolute: 0.1 10*3/uL (ref 0.0–0.7)
HCT: 33.7 % — ABNORMAL LOW (ref 36.0–46.0)
Hemoglobin: 11.1 g/dL — ABNORMAL LOW (ref 12.0–15.0)
Lymphocytes Relative: 20 % (ref 12–46)
Lymphs Abs: 1.1 10*3/uL (ref 0.7–4.0)
MCH: 28.7 pg (ref 26.0–34.0)
MCHC: 32.9 g/dL (ref 30.0–36.0)
MCV: 87.1 fL (ref 78.0–100.0)
Monocytes Absolute: 0.5 10*3/uL (ref 0.1–1.0)
Monocytes Relative: 8 % (ref 3–12)
Neutro Abs: 4.1 10*3/uL (ref 1.7–7.7)
Neutrophils Relative %: 70 % (ref 43–77)
PLATELETS: 349 10*3/uL (ref 150–400)
RBC: 3.87 MIL/uL (ref 3.87–5.11)
RDW: 13.1 % (ref 11.5–15.5)
WBC: 5.9 10*3/uL (ref 4.0–10.5)

## 2014-06-16 LAB — LIPASE, BLOOD: Lipase: 34 U/L (ref 11–59)

## 2014-06-16 LAB — URINALYSIS, ROUTINE W REFLEX MICROSCOPIC
BILIRUBIN URINE: NEGATIVE
GLUCOSE, UA: NEGATIVE mg/dL
Hgb urine dipstick: NEGATIVE
KETONES UR: NEGATIVE mg/dL
Leukocytes, UA: NEGATIVE
Nitrite: NEGATIVE
PROTEIN: NEGATIVE mg/dL
Specific Gravity, Urine: 1.025 (ref 1.005–1.030)
UROBILINOGEN UA: 0.2 mg/dL (ref 0.0–1.0)
pH: 5.5 (ref 5.0–8.0)

## 2014-06-16 LAB — TROPONIN I

## 2014-06-16 MED ORDER — POTASSIUM CHLORIDE CRYS ER 20 MEQ PO TBCR
40.0000 meq | EXTENDED_RELEASE_TABLET | Freq: Once | ORAL | Status: AC
  Administered 2014-06-16: 40 meq via ORAL
  Filled 2014-06-16: qty 2

## 2014-06-16 NOTE — Discharge Instructions (Signed)
Nausea and Vomiting Your blood potassium was low at 3.0 tonight , which likely occurred as a result of vomiting. You were given a potassium dosage here. Call Dr.Fanta's office tomorrow to arrange to be seen in the office within a week. Ask the doctor to recheck your blood potassium level. Nausea is a sick feeling that often comes before throwing up (vomiting). Vomiting is a reflex where stomach contents come out of your mouth. Vomiting can cause severe loss of body fluids (dehydration). Children and elderly adults can become dehydrated quickly, especially if they also have diarrhea. Nausea and vomiting are symptoms of a condition or disease. It is important to find the cause of your symptoms. CAUSES   Direct irritation of the stomach lining. This irritation can result from increased acid production (gastroesophageal reflux disease), infection, food poisoning, taking certain medicines (such as nonsteroidal anti-inflammatory drugs), alcohol use, or tobacco use.  Signals from the brain.These signals could be caused by a headache, heat exposure, an inner ear disturbance, increased pressure in the brain from injury, infection, a tumor, or a concussion, pain, emotional stimulus, or metabolic problems.  An obstruction in the gastrointestinal tract (bowel obstruction).  Illnesses such as diabetes, hepatitis, gallbladder problems, appendicitis, kidney problems, cancer, sepsis, atypical symptoms of a heart attack, or eating disorders.  Medical treatments such as chemotherapy and radiation.  Receiving medicine that makes you sleep (general anesthetic) during surgery. DIAGNOSIS Your caregiver may ask for tests to be done if the problems do not improve after a few days. Tests may also be done if symptoms are severe or if the reason for the nausea and vomiting is not clear. Tests may include:  Urine tests.  Blood tests.  Stool tests.  Cultures (to look for evidence of infection).  X-rays or other  imaging studies. Test results can help your caregiver make decisions about treatment or the need for additional tests. TREATMENT You need to stay well hydrated. Drink frequently but in small amounts.You may wish to drink water, sports drinks, clear broth, or eat frozen ice pops or gelatin dessert to help stay hydrated.When you eat, eating slowly may help prevent nausea.There are also some antinausea medicines that may help prevent nausea. HOME CARE INSTRUCTIONS   Take all medicine as directed by your caregiver.  If you do not have an appetite, do not force yourself to eat. However, you must continue to drink fluids.  If you have an appetite, eat a normal diet unless your caregiver tells you differently.  Eat a variety of complex carbohydrates (rice, wheat, potatoes, bread), lean meats, yogurt, fruits, and vegetables.  Avoid high-fat foods because they are more difficult to digest.  Drink enough water and fluids to keep your urine clear or pale yellow.  If you are dehydrated, ask your caregiver for specific rehydration instructions. Signs of dehydration may include:  Severe thirst.  Dry lips and mouth.  Dizziness.  Dark urine.  Decreasing urine frequency and amount.  Confusion.  Rapid breathing or pulse. SEEK IMMEDIATE MEDICAL CARE IF:   You have blood or brown flecks (like coffee grounds) in your vomit.  You have black or bloody stools.  You have a severe headache or stiff neck.  You are confused.  You have severe abdominal pain.  You have chest pain or trouble breathing.  You do not urinate at least once every 8 hours.  You develop cold or clammy skin.  You continue to vomit for longer than 24 to 48 hours.  You have a fever.  MAKE SURE YOU:   Understand these instructions.  Will watch your condition.  Will get help right away if you are not doing well or get worse. Document Released: 02/20/2005 Document Revised: 05/15/2011 Document Reviewed:  07/20/2010 [facility] Patient Information 2015 [place], [place]. This information is not intended to replace advice given to you by your health care provider. Make sure you discuss any questions you have with your health care provider.

## 2014-06-16 NOTE — ED Notes (Signed)
Patient states that she was vomiting all at once. States that she is nervous and can't walk real well. States that she has only vomited once around 1 hour ago.

## 2014-06-16 NOTE — ED Provider Notes (Signed)
CSN: [id number]     Arrival date & time 06/16/14  1849 History   None    Chief Complaint  Patient presents with  . Emesis     (Consider location/radiation/quality/duration/timing/severity/associated sxs/prior Treatment) HPI patient developed vomiting today. She states she vomited 5 times between the hours of 3 and 4 PM today. She presently feels nervous. Denies other complaint. Denies having had any chest pain, headache, abdominal pain, no urinary symptoms no fever. No treatment prior to coming here. No other associated symptoms. She feels better than she did a few hours ago.  Past Medical History  Diagnosis Date  . Arthritis   . Hypertension    Past Surgical History  Procedure Laterality Date  . Back surgery    . Replacement total knee bilateral     Family History  Problem Relation Age of Onset  . Heart attack Mother    History  Substance Use Topics  . Smoking status: Former Smoker -- 0.05 packs/day for 20 years    Types: Cigarettes    Quit date: 03/06/1993  . Smokeless tobacco: Current User    Types: Snuff  . Alcohol Use: No   OB History    Gravida Para Term Preterm AB TAB SAB Ectopic Multiple Living   2 2 2       2      Review of Systems  Constitutional: Negative.   HENT: Negative.   Respiratory: Negative.   Cardiovascular: Positive for leg swelling.       Leg swelling for several days  Gastrointestinal: Positive for vomiting.  Musculoskeletal: Positive for gait problem.       Walks with cane  Skin: Negative.   Neurological: Negative.   Psychiatric/Behavioral: Negative.        Is anxious  All other systems reviewed and are negative.     Allergies  Review of patient's allergies indicates no known allergies.  Home Medications   Prior to Admission medications   Medication Sig Start Date End Date Taking? Authorizing Provider  aspirin EC 81 MG tablet Take 81 mg by mouth daily.    Historical Provider, MD  Aspirin-Acetaminophen-Caffeine (GOODY HEADACHE PO)  Take 1 Package by mouth daily as needed (headache).    Historical Provider, MD  ibuprofen (ADVIL,MOTRIN) 200 MG tablet Take 400 mg by mouth every 6 (six) hours as needed for moderate pain.    Historical Provider, MD  losartan-hydrochlorothiazide (HYZAAR) 100-12.5 MG per tablet Take 1 tablet by mouth daily. 12/08/13   Historical Provider, MD  predniSONE (DELTASONE) 10 MG tablet Take 1 tablet (10 mg total) by mouth daily with breakfast. Patient not taking: Reported on 01/09/2014 09/08/13   Ashley Murrain, NP  SYNTHROID 100 MCG tablet Take 1 tablet by mouth daily. 11/29/13   Historical Provider, MD   BP 168/47 mmHg  Pulse 63  Temp(Src) 98.3 F (36.8 C) (Oral)  Resp 14  Ht 5\' 2"  (1.575 m)  Wt 156 lb (70.761 kg)  BMI 28.53 kg/m2  SpO2 100% Physical Exam  Constitutional: She is oriented to person, place, and time. She appears well-developed and well-nourished.  HENT:  Head: Normocephalic and atraumatic.  Eyes: Conjunctivae are normal. Pupils are equal, round, and reactive to light.  Neck: Neck supple. No tracheal deviation present. No thyromegaly present.  Cardiovascular: Normal rate and regular rhythm.   No murmur heard. Pulmonary/Chest: Effort normal and breath sounds normal.  Abdominal: Soft. Bowel sounds are normal. She exhibits no distension. There is no tenderness.  Musculoskeletal: Normal range of  motion. She exhibits edema. She exhibits no tenderness.  Trace pretibial pitting edema bilaterally  Neurological: She is alert and oriented to person, place, and time. No cranial nerve deficit. Coordination normal.  Walks with cane, unassisted. Shuffling gait which she reports is her baseline  Skin: Skin is warm and dry. No rash noted.  Psychiatric: She has a normal mood and affect.  Nursing note and vitals reviewed.   ED Course  Procedures (including critical care time) Labs Review Labs Reviewed - No data to display  Imaging Review No results found.   EKG Interpretation   Date/Time:   Tuesday June 16 2014 20:45:13 EDT Ventricular Rate:  73 PR Interval:  180 QRS Duration: 92 QT Interval:  422 QTC Calculation: 465 R Axis:   -18 Text Interpretation:  Sinus rhythm Borderline left axis deviation No  significant change since last tracing Confirmed by Winfred Leeds  MD, Vickii Volland  [phone]) on 06/16/2014 8:49:27 PM     10:50 PM patient resting comfortably. Asymptomatic Results for orders placed or performed during the hospital encounter of 06/16/14  Urinalysis, Routine w reflex microscopic  Result Value Ref Range   Color, Urine YELLOW YELLOW   APPearance CLEAR CLEAR   Specific Gravity, Urine 1.025 1.005 - 1.030   pH 5.5 5.0 - 8.0   Glucose, UA NEGATIVE NEGATIVE mg/dL   Hgb urine dipstick NEGATIVE NEGATIVE   Bilirubin Urine NEGATIVE NEGATIVE   Ketones, ur NEGATIVE NEGATIVE mg/dL   Protein, ur NEGATIVE NEGATIVE mg/dL   Urobilinogen, UA 0.2 0.0 - 1.0 mg/dL   Nitrite NEGATIVE NEGATIVE   Leukocytes, UA NEGATIVE NEGATIVE  Comprehensive metabolic panel  Result Value Ref Range   Sodium 132 (L) 135 - 145 mmol/L   Potassium 3.0 (L) 3.5 - 5.1 mmol/L   Chloride 99 96 - 112 mmol/L   CO2 26 19 - 32 mmol/L   Glucose, Bld 111 (H) 70 - 99 mg/dL   BUN 18 6 - 23 mg/dL   Creatinine, Ser 0.86 0.50 - 1.10 mg/dL   Calcium 9.8 8.4 - 10.5 mg/dL   Total Protein 7.0 6.0 - 8.3 g/dL   Albumin 3.7 3.5 - 5.2 g/dL   AST 16 0 - 37 U/L   ALT 7 0 - 35 U/L   Alkaline Phosphatase 40 39 - 117 U/L   Total Bilirubin 0.4 0.3 - 1.2 mg/dL   GFR calc non Af Amer 61 (L) >90 mL/min   GFR calc Af Amer 70 (L) >90 mL/min   Anion gap 7 5 - 15  CBC with Differential/Platelet  Result Value Ref Range   WBC 5.9 4.0 - 10.5 K/uL   RBC 3.87 3.87 - 5.11 MIL/uL   Hemoglobin 11.1 (L) 12.0 - 15.0 g/dL   HCT 33.7 (L) 36.0 - 46.0 %   MCV 87.1 78.0 - 100.0 fL   MCH 28.7 26.0 - 34.0 pg   MCHC 32.9 30.0 - 36.0 g/dL   RDW 13.1 11.5 - 15.5 %   Platelets 349 150 - 400 K/uL   Neutrophils Relative % 70 43 - 77 %   Neutro Abs  4.1 1.7 - 7.7 K/uL   Lymphocytes Relative 20 12 - 46 %   Lymphs Abs 1.1 0.7 - 4.0 K/uL   Monocytes Relative 8 3 - 12 %   Monocytes Absolute 0.5 0.1 - 1.0 K/uL   Eosinophils Relative 1 0 - 5 %   Eosinophils Absolute 0.1 0.0 - 0.7 K/uL   Basophils Relative 1 0 - 1 %  Basophils Absolute 0.1 0.0 - 0.1 K/uL  Lipase, blood  Result Value Ref Range   Lipase 34 11 - 59 U/L  Troponin I  Result Value Ref Range   Troponin I <0.03 <0.031 ng/mL   No results found.  MDM    Doubt ACS . Non acute ECG, no chest painPt signed out to Dr. Lita Mains at 11 pm Dx #1 vomiting #2 anxiety #3 hypokalemia  Final diagnoses:  None        Orlie Dakin, MD 06/16/14 2310

## 2014-07-27 MED ORDER — PHENYLEPHRINE HCL 2.5 % OP SOLN
OPHTHALMIC | Status: AC
  Filled 2014-07-27: qty 15

## 2014-07-27 MED ORDER — TETRACAINE HCL 0.5 % OP SOLN
OPHTHALMIC | Status: AC
  Filled 2014-07-27: qty 2

## 2014-07-27 MED ORDER — CYCLOPENTOLATE-PHENYLEPHRINE OP SOLN OPTIME - NO CHARGE
OPHTHALMIC | Status: AC
  Filled 2014-07-27: qty 2

## 2014-07-27 MED ORDER — KETOROLAC TROMETHAMINE 0.5 % OP SOLN
OPHTHALMIC | Status: AC
  Filled 2014-07-27: qty 5

## 2014-07-28 ENCOUNTER — Ambulatory Visit (HOSPITAL_COMMUNITY)
Admission: RE | Admit: 2014-07-28 | Discharge: 2014-07-28 | Disposition: A | Discharge status: Home / Self Care | Payer: Commercial Managed Care - HMO | Source: Ambulatory Visit | Attending: Ophthalmology | Admitting: Ophthalmology

## 2014-07-28 ENCOUNTER — Encounter (HOSPITAL_COMMUNITY)
Admission: RE | Disposition: A | Discharge status: Home / Self Care | Payer: Self-pay | Source: Ambulatory Visit | Attending: Ophthalmology

## 2014-07-28 DIAGNOSIS — H26491 Other secondary cataract, right eye: Secondary | ICD-10-CM | POA: Diagnosis not present

## 2014-07-28 HISTORY — PX: YAG LASER APPLICATION: SHX6189

## 2014-07-28 SURGERY — TREATMENT, USING YAG LASER
Anesthesia: LOCAL | Laterality: Right

## 2014-07-28 MED ORDER — CYCLOPENTOLATE-PHENYLEPHRINE 0.2-1 % OP SOLN
1.0000 [drp] | OPHTHALMIC | Status: AC
  Administered 2014-07-28 (×2): 1 [drp] via OPHTHALMIC

## 2014-07-28 MED ORDER — CYCLOPENTOLATE-PHENYLEPHRINE OP SOLN OPTIME - NO CHARGE
OPHTHALMIC | Status: AC
  Filled 2014-07-28: qty 2

## 2014-07-28 NOTE — Discharge Instructions (Signed)
MARRISA KIMBER  07/28/2014     Instructions    Activity: No Restrictions.   Diet: Resume Diet you were on at home.   Pain Medication: Tylenol if Needed.   CONTACT YOUR DOCTOR IF YOU HAVE PAIN, REDNESS IN YOUR EYE, OR DECREASED VISION.   Follow-up:in 2 days with Rutherford Guys, MD.   Dr. Gershon Crane: [phone]  Dr. Iona Hansen: [phone]  Dr. Geoffry Paradise: [phone]   If you find that you cannot contact your physician, but feel that your signs and   Symptoms warrant a physician's attention, call the Emergency Room at   [phone] [phone].   Othern/a.

## 2014-07-28 NOTE — H&P (Signed)
The patient was re examined and there is no change in the patients condition since the original H and P. 

## 2014-07-28 NOTE — Op Note (Signed)
Juandedios Dudash T. Gershon Crane, MD  Procedure: Yag Capsulotomy  Yag Laser Self Test Completedyes. Procedure: Posterior Capsulotomy, Eye Protection Worn by Staff yes. Laser In Use Sign on Door yes.  Laser: Nd:YAG Spot Size: Fixed Burst Mode: III Power Setting: 3.2 mJ/burst Number of shots: 25 Total energy delivered: 77.42 mJ   The patient tolerated the procedure without difficulty. No complications were encountered.   The patient was discharged home with the instructions to continue all her current glaucoma medications, if any.   Patient instructed to go to office at 0200 for intraocular pressure check.  Patient verbalizes understanding of discharge instructions Yes.  .   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

## 2014-07-29 ENCOUNTER — Encounter (HOSPITAL_COMMUNITY): Payer: Self-pay | Admitting: Ophthalmology

## 2014-08-19 ENCOUNTER — Other Ambulatory Visit (HOSPITAL_COMMUNITY): Payer: Self-pay | Admitting: Internal Medicine

## 2014-08-19 DIAGNOSIS — M1990 Unspecified osteoarthritis, unspecified site: Secondary | ICD-10-CM

## 2014-08-24 ENCOUNTER — Other Ambulatory Visit (HOSPITAL_COMMUNITY): Payer: Self-pay | Admitting: Internal Medicine

## 2014-08-24 DIAGNOSIS — M1990 Unspecified osteoarthritis, unspecified site: Secondary | ICD-10-CM

## 2014-08-25 ENCOUNTER — Ambulatory Visit (HOSPITAL_COMMUNITY)
Admission: RE | Admit: 2014-08-25 | Discharge: 2014-08-25 | Disposition: A | Discharge status: Home / Self Care | Payer: Commercial Managed Care - HMO | Source: Ambulatory Visit | Attending: Internal Medicine | Admitting: Internal Medicine

## 2014-08-25 DIAGNOSIS — Z780 Asymptomatic menopausal state: Secondary | ICD-10-CM | POA: Insufficient documentation

## 2014-08-25 DIAGNOSIS — M1990 Unspecified osteoarthritis, unspecified site: Secondary | ICD-10-CM | POA: Diagnosis present

## 2014-10-31 ENCOUNTER — Emergency Department (HOSPITAL_COMMUNITY): Payer: Commercial Managed Care - HMO

## 2014-10-31 ENCOUNTER — Emergency Department (HOSPITAL_COMMUNITY)
Admission: EM | Admit: 2014-10-31 | Discharge: 2014-10-31 | Disposition: A | Discharge status: Home / Self Care | Payer: Commercial Managed Care - HMO | Attending: Emergency Medicine | Admitting: Emergency Medicine

## 2014-10-31 ENCOUNTER — Encounter (HOSPITAL_COMMUNITY): Payer: Self-pay | Admitting: *Deleted

## 2014-10-31 DIAGNOSIS — Z79899 Other long term (current) drug therapy: Secondary | ICD-10-CM | POA: Insufficient documentation

## 2014-10-31 DIAGNOSIS — M1990 Unspecified osteoarthritis, unspecified site: Secondary | ICD-10-CM | POA: Diagnosis not present

## 2014-10-31 DIAGNOSIS — Z87891 Personal history of nicotine dependence: Secondary | ICD-10-CM | POA: Insufficient documentation

## 2014-10-31 DIAGNOSIS — Z7982 Long term (current) use of aspirin: Secondary | ICD-10-CM | POA: Insufficient documentation

## 2014-10-31 DIAGNOSIS — R1032 Left lower quadrant pain: Secondary | ICD-10-CM | POA: Insufficient documentation

## 2014-10-31 DIAGNOSIS — I10 Essential (primary) hypertension: Secondary | ICD-10-CM | POA: Diagnosis not present

## 2014-10-31 DIAGNOSIS — R52 Pain, unspecified: Secondary | ICD-10-CM

## 2014-10-31 DIAGNOSIS — M545 Low back pain: Secondary | ICD-10-CM | POA: Insufficient documentation

## 2014-10-31 DIAGNOSIS — M25552 Pain in left hip: Secondary | ICD-10-CM

## 2014-10-31 LAB — BASIC METABOLIC PANEL
Anion gap: 5 (ref 5–15)
BUN: 11 mg/dL (ref 6–20)
CHLORIDE: 98 mmol/L — AB (ref 101–111)
CO2: 27 mmol/L (ref 22–32)
Calcium: 9.2 mg/dL (ref 8.9–10.3)
Creatinine, Ser: 0.91 mg/dL (ref 0.44–1.00)
GFR calc Af Amer: >60 mL/min (ref >60)
GFR calc non Af Amer: 57 mL/min — ABNORMAL LOW (ref >60)
Glucose, Bld: 100 mg/dL — ABNORMAL HIGH (ref 65–99)
POTASSIUM: 3.5 mmol/L (ref 3.5–5.1)
Sodium: 130 mmol/L — ABNORMAL LOW (ref 135–145)

## 2014-10-31 LAB — URINALYSIS, ROUTINE W REFLEX MICROSCOPIC
BILIRUBIN URINE: NEGATIVE
Glucose, UA: NEGATIVE mg/dL
KETONES UR: NEGATIVE mg/dL
Leukocytes, UA: NEGATIVE
Nitrite: NEGATIVE
PH: 7 (ref 5.0–8.0)
Protein, ur: NEGATIVE mg/dL
Specific Gravity, Urine: <1.005 — ABNORMAL LOW (ref 1.005–1.030)
Urobilinogen, UA: 0.2 mg/dL (ref 0.0–1.0)

## 2014-10-31 LAB — CBC WITH DIFFERENTIAL/PLATELET
Basophils Absolute: 0.1 10*3/uL (ref 0.0–0.1)
Basophils Relative: 1 % (ref 0–1)
EOS PCT: 5 % (ref 0–5)
Eosinophils Absolute: 0.2 10*3/uL (ref 0.0–0.7)
HCT: 31.7 % — ABNORMAL LOW (ref 36.0–46.0)
Hemoglobin: 10.8 g/dL — ABNORMAL LOW (ref 12.0–15.0)
LYMPHS ABS: 1.7 10*3/uL (ref 0.7–4.0)
LYMPHS PCT: 37 % (ref 12–46)
MCH: 28.7 pg (ref 26.0–34.0)
MCHC: 34.1 g/dL (ref 30.0–36.0)
MCV: 84.3 fL (ref 78.0–100.0)
MONO ABS: 0.6 10*3/uL (ref 0.1–1.0)
Monocytes Relative: 12 % (ref 3–12)
Neutro Abs: 2.1 10*3/uL (ref 1.7–7.7)
Neutrophils Relative %: 45 % (ref 43–77)
PLATELETS: 281 10*3/uL (ref 150–400)
RBC: 3.76 MIL/uL — ABNORMAL LOW (ref 3.87–5.11)
RDW: 14.1 % (ref 11.5–15.5)
WBC: 4.7 10*3/uL (ref 4.0–10.5)

## 2014-10-31 LAB — URINE MICROSCOPIC-ADD ON

## 2014-10-31 MED ORDER — OXYCODONE-ACETAMINOPHEN 5-325 MG PO TABS: 1.0000 | ORAL_TABLET | Freq: Three times a day (TID) | ORAL | Status: DC | PRN

## 2014-10-31 MED ORDER — SODIUM CHLORIDE 0.9 % IJ SOLN
INTRAMUSCULAR | Status: AC
  Filled 2014-10-31: qty 36

## 2014-10-31 MED ORDER — IOHEXOL 300 MG/ML  SOLN
25.0000 mL | Freq: Once | INTRAMUSCULAR | Status: AC | PRN
  Administered 2014-10-31: 25 mL via ORAL

## 2014-10-31 MED ORDER — IOHEXOL 300 MG/ML  SOLN
100.0000 mL | Freq: Once | INTRAMUSCULAR | Status: AC | PRN
  Administered 2014-10-31: 100 mL via INTRAVENOUS

## 2014-10-31 MED ORDER — FENTANYL CITRATE (PF) 100 MCG/2ML IJ SOLN
50.0000 ug | Freq: Once | INTRAMUSCULAR | Status: AC
  Administered 2014-10-31: 50 ug via INTRAVENOUS
  Filled 2014-10-31: qty 2

## 2014-10-31 MED ORDER — SODIUM CHLORIDE 0.9 % IJ SOLN
INTRAMUSCULAR | Status: AC
  Filled 2014-10-31: qty 600

## 2014-10-31 NOTE — ED Notes (Signed)
Left hip pain x ~1 week, worsening over past two days. Unable to bear weight to left leg today. No known injury

## 2014-10-31 NOTE — ED Notes (Signed)
Patient made aware that an urine sample is needed at this time.

## 2014-10-31 NOTE — ED Provider Notes (Signed)
CSN: [id number]     Arrival date & time 10/31/14  0737 History  This chart was scribed for Ripley Fraise, MD by Starleen Arms, ED Scribe. This patient was seen in room APA18/[facility]18 and the patient's care was started at 8:59 AM.   Chief Complaint  Patient presents with  . Hip Pain   The history is provided by the patient. No language interpreter was used.   HPI Comments: Jeanette Brady is a 81 y.o. female with hx of arthritis, HTN, who presents to the Emergency Department complaining of left-sided hip pain onset 1 week ago without injury/fall and worsening 2 days ago.  She is unable to bear weight due to pain and also notes some less severe back pain only with walking that is normal to baseline.  She denies history of hip surgery.  She denies bowel/bladder incontinence, fever, vomiting, CP, abdominal pain, extremity numbness, dysuria, leg weakness.   Past Medical History  Diagnosis Date  . Arthritis   . Hypertension    Past Surgical History  Procedure Laterality Date  . Back surgery    . Replacement total knee bilateral    . Yag laser application Right 03/11/2692    Procedure: YAG LASER APPLICATION;  Surgeon: Rutherford Guys, MD;  Location: [facility];  Service: Ophthalmology;  Laterality: Right;   Family History  Problem Relation Age of Onset  . Heart attack Mother    Social History  Substance Use Topics  . Smoking status: Former Smoker -- 0.05 packs/day for 20 years    Types: Cigarettes    Quit date: 03/06/1993  . Smokeless tobacco: Current User    Types: Snuff  . Alcohol Use: No   OB History    Gravida Para Term Preterm AB TAB SAB Ectopic Multiple Living   2 2 2       2      Review of Systems  Constitutional: Negative for fever.  Cardiovascular: Negative for chest pain.  Gastrointestinal: Negative for vomiting.  Musculoskeletal: Positive for back pain and arthralgias.  All other systems reviewed and are negative.     Allergies  Review of patient's allergies indicates no  known allergies.  Home Medications   Prior to Admission medications   Medication Sig Start Date End Date Taking? Authorizing Provider  aspirin EC 81 MG tablet Take 81 mg by mouth daily.    Historical Provider, MD  Aspirin-Acetaminophen-Caffeine (GOODY HEADACHE PO) Take 1 Package by mouth daily as needed (headache).    Historical Provider, MD  gabapentin (NEURONTIN) 300 MG capsule Take 300 mg by mouth 2 (two) times daily.  05/26/14   Historical Provider, MD  losartan-hydrochlorothiazide (HYZAAR) 100-12.5 MG per tablet Take 1 tablet by mouth daily. 12/08/13   Historical Provider, MD  predniSONE (DELTASONE) 10 MG tablet Take 1 tablet (10 mg total) by mouth daily with breakfast. Patient not taking: Reported on 01/09/2014 09/08/13   Ashley Murrain, NP  SYNTHROID 100 MCG tablet Take 1 tablet by mouth daily. 11/29/13   Historical Provider, MD   BP 171/58 mmHg  Pulse 60  Temp(Src) 98.4 F (36.9 C) (Oral)  Resp 18  Ht 5' (1.524 m)  Wt 136 lb (61.689 kg)  BMI 26.56 kg/m2  SpO2 100% Physical Exam  Nursing note and vitals reviewed. CONSTITUTIONAL: Well developed/well nourished HEAD: Normocephalic/atraumatic EYES: EOMI/PERRL ENMT: Mucous membranes moist NECK: supple no meningeal signs SPINE/BACK:entire spine nontender CV: S1/S2 noted, no murmurs/rubs/gallops noted LUNGS: Lungs are clear to auscultation bilaterally, no apparent distress ABDOMEN: soft, moderate  LLQ tenderness, no rebound or guarding, bowel sounds noted throughout abdomen GU:no cva tenderness NEURO: Pt is awake/alert/appropriate, moves all extremitiesx4.  No facial droop.   EXTREMITIES: pulses normal/equal, full ROM; TTP of left hip, no deformity or warmth, left femoral pulse intact, no thrill noted, patient able to flex left hip but limited due to pain; LLE is warm to touch and no discoloration. SKIN: warm, color normal PSYCH: no abnormalities of mood noted, alert and oriented to situation  ED Course  Procedures   DIAGNOSTIC  STUDIES: Oxygen Saturation is 100% on RA, normal by my interpretation.    COORDINATION OF CARE:  9:09 AM Will order labs and imaging.  Patient acknowledges and agrees with plan.    Labs Review Labs Reviewed  BASIC METABOLIC PANEL - Abnormal; Notable for the following:    Sodium 130 (*)    Chloride 98 (*)    Glucose, Bld 100 (*)    GFR calc non Af Amer 57 (*)    All other components within normal limits  CBC WITH DIFFERENTIAL/PLATELET - Abnormal; Notable for the following:    RBC 3.76 (*)    Hemoglobin 10.8 (*)    HCT 31.7 (*)    All other components within normal limits  URINALYSIS, ROUTINE W REFLEX MICROSCOPIC (NOT AT [facility]) - Abnormal; Notable for the following:    Specific Gravity, Urine <1.005 (*)    Hgb urine dipstick TRACE (*)    All other components within normal limits  URINE MICROSCOPIC-ADD ON    Imaging Review Ct Abdomen Pelvis W Contrast  10/31/2014   CLINICAL DATA:  LLQ pain upon exam . Pt seeking treatment for left hip pain. Unable to bear weight on left hip today. Hx of back surgery and HTN  EXAM: CT ABDOMEN AND PELVIS WITH CONTRAST  TECHNIQUE: Multidetector CT imaging of the abdomen and pelvis was performed using the standard protocol following bolus administration of intravenous contrast.  CONTRAST:  23mL OMNIPAQUE IOHEXOL 300 MG/ML SOLN, 1106mL OMNIPAQUE IOHEXOL 300 MG/ML SOLN  COMPARISON:  None.  FINDINGS: Lung bases: Minor subsegmental atelectasis, and/ or scarring. No lung consolidation or edema. No pleural effusion. Heart normal in size. Bilateral breast dystrophic calcifications.  Liver: Multiple water density liver masses, largest in the posterior segment of the right lobe measuring 3.5 cm, all consistent with cysts. No other liver abnormality.  Spleen, gallbladder, pancreas, adrenal glands:  Unremarkable.  Kidneys, ureters, bladder small low-density renal lesions consistent with cysts. No renal stones. No hydronephrosis. Ureters are normal in course and in caliber.  Bladder is unremarkable.  Uterus and adnexa:  Multiple uterine fibroids.  No adnexal masses.  Lymph nodes:  No adenopathy.  Ascites:  None.  Gastrointestinal: No bowel dilation to suggest obstruction or ileus. No bowel wall thickening or mesenteric inflammation. Appendix not definitively seen. No evidence of appendicitis.  Vascular: Atherosclerotic calcifications are noted throughout the abdominal aorta the iliac vessels no aneurysm.  Musculoskeletal Ca mild depression of the upper endplate of L1 with associated Schmorl's node and sclerosis. This appears chronic. No evidence of acute fracture. Specifically, no hip fracture both hip joints show concentric joint space narrowing. There are significant degenerative changes noted throughout the visualized spine including a grade 1 anterolisthesis of L4 on L5. No osteoblastic or osteolytic lesions.  IMPRESSION: 1. Cause of this patient's left hip pain is unclear. There is no evidence of a left hip fracture. There is mild concentric hip joint space narrowing bilaterally. No other hip arthropathic change. There are significant  degenerative changes throughout the visualized spine as well as a mild chronic fracture of L1. Patient is hip pain may originate from the lumbar spine. 2. No findings to explain this patient's left lower quadrant pain. No evidence diverticulitis or other acute finding in the abdomen or pelvis. 3. Multiple liver cysts.  Small renal cysts. 4. Multiple uterine fibroids. 5. Atherosclerotic changes along the abdominal aorta its branch vessels.   Electronically Signed   By: Lajean Manes M.D.   On: 10/31/2014 11:01   Dg Hip Unilat With Pelvis 2-3 Views Left  10/31/2014   CLINICAL DATA:  LEFT hip pain for 2-3 days, no known injury, difficulty moving LEFT leg today, history arthritis, BILATERAL knee replacements  EXAM: DG HIP (WITH OR WITHOUT PELVIS) 2-3V LEFT  COMPARISON:  None  FINDINGS: Diffuse osseous demineralization.  Hip and SI joint spaces  symmetric and preserved.  No acute fracture, dislocation or bone destruction.  Excreted urinary tract contrast within distal ureters and bladder from prior CT.  GI contrast within bowel loops projects over pelvis.  Extensive scattered atherosclerotic calcifications.  IMPRESSION: Osseous demineralization.  No acute osseous abnormalities.   Electronically Signed   By: Lavonia Dana M.D.   On: 10/31/2014 11:35   I have personally reviewed and evaluated these images and lab results as part of my medical decision-making.   Initial concern for possible occult hip injury and also intra-abdominal process due to ABD pain/tenderness Pt improved Imaging negative She was ambulatory with cane (baseline) She is well appearing She wants to go home Advised PCP Followup Short course of pain meds given (advised to avoid NSAIDs for age) BP 171/58 mmHg  Pulse 60  Temp(Src) 98.4 F (36.9 C) (Oral)  Resp 18  Ht 5' (1.524 m)  Wt 136 lb (61.689 kg)  BMI 26.56 kg/m2  SpO2 100%  MDM   Final diagnoses:  Arthralgia of left hip  LLQ abdominal pain    Nursing notes including past medical history and social history reviewed and considered in documentation Labs/vital reviewed myself and considered during evaluation xrays/imaging reviewed by myself and considered during evaluation    I personally performed the services described in this documentation, which was scribed in my presence. The recorded information has been reviewed and is accurate.      Ripley Fraise, MD 10/31/14 1335

## 2015-10-15 ENCOUNTER — Encounter (HOSPITAL_COMMUNITY): Payer: Self-pay | Admitting: Emergency Medicine

## 2015-10-15 ENCOUNTER — Observation Stay (HOSPITAL_COMMUNITY)
Admission: EM | Admit: 2015-10-15 | Discharge: 2015-10-16 | Disposition: A | Discharge status: Home Health | Payer: Commercial Managed Care - HMO | Attending: Internal Medicine | Admitting: Internal Medicine

## 2015-10-15 DIAGNOSIS — E871 Hypo-osmolality and hyponatremia: Secondary | ICD-10-CM | POA: Diagnosis not present

## 2015-10-15 DIAGNOSIS — N289 Disorder of kidney and ureter, unspecified: Secondary | ICD-10-CM | POA: Diagnosis not present

## 2015-10-15 DIAGNOSIS — M1990 Unspecified osteoarthritis, unspecified site: Secondary | ICD-10-CM | POA: Diagnosis not present

## 2015-10-15 DIAGNOSIS — Z7982 Long term (current) use of aspirin: Secondary | ICD-10-CM | POA: Diagnosis not present

## 2015-10-15 DIAGNOSIS — R011 Cardiac murmur, unspecified: Secondary | ICD-10-CM | POA: Diagnosis present

## 2015-10-15 DIAGNOSIS — Z96653 Presence of artificial knee joint, bilateral: Secondary | ICD-10-CM | POA: Diagnosis not present

## 2015-10-15 DIAGNOSIS — Z87891 Personal history of nicotine dependence: Secondary | ICD-10-CM | POA: Diagnosis not present

## 2015-10-15 DIAGNOSIS — I951 Orthostatic hypotension: Secondary | ICD-10-CM | POA: Insufficient documentation

## 2015-10-15 DIAGNOSIS — E038 Other specified hypothyroidism: Secondary | ICD-10-CM | POA: Diagnosis not present

## 2015-10-15 DIAGNOSIS — D649 Anemia, unspecified: Secondary | ICD-10-CM | POA: Insufficient documentation

## 2015-10-15 DIAGNOSIS — R55 Syncope and collapse: Secondary | ICD-10-CM | POA: Diagnosis not present

## 2015-10-15 DIAGNOSIS — E039 Hypothyroidism, unspecified: Secondary | ICD-10-CM | POA: Diagnosis not present

## 2015-10-15 DIAGNOSIS — R262 Difficulty in walking, not elsewhere classified: Secondary | ICD-10-CM | POA: Insufficient documentation

## 2015-10-15 DIAGNOSIS — E860 Dehydration: Secondary | ICD-10-CM | POA: Insufficient documentation

## 2015-10-15 DIAGNOSIS — I10 Essential (primary) hypertension: Secondary | ICD-10-CM | POA: Insufficient documentation

## 2015-10-15 LAB — CBC
HEMATOCRIT: 35.5 % — AB (ref 36.0–46.0)
HEMOGLOBIN: 11.6 g/dL — AB (ref 12.0–15.0)
MCH: 29.3 pg (ref 26.0–34.0)
MCHC: 32.7 g/dL (ref 30.0–36.0)
MCV: 89.6 fL (ref 78.0–100.0)
Platelets: 260 10*3/uL (ref 150–400)
RBC: 3.96 MIL/uL (ref 3.87–5.11)
RDW: 12.7 % (ref 11.5–15.5)
WBC: 7.2 10*3/uL (ref 4.0–10.5)

## 2015-10-15 LAB — URINALYSIS, ROUTINE W REFLEX MICROSCOPIC
BILIRUBIN URINE: NEGATIVE
Glucose, UA: NEGATIVE mg/dL
HGB URINE DIPSTICK: NEGATIVE
Ketones, ur: NEGATIVE mg/dL
Nitrite: NEGATIVE
PH: 7 (ref 5.0–8.0)
Protein, ur: NEGATIVE mg/dL
SPECIFIC GRAVITY, URINE: 1.017 (ref 1.005–1.030)

## 2015-10-15 LAB — BASIC METABOLIC PANEL
ANION GAP: 7 (ref 5–15)
BUN: 16 mg/dL (ref 6–20)
CO2: 26 mmol/L (ref 22–32)
Calcium: 10.8 mg/dL — ABNORMAL HIGH (ref 8.9–10.3)
Chloride: 97 mmol/L — ABNORMAL LOW (ref 101–111)
Creatinine, Ser: 1.26 mg/dL — ABNORMAL HIGH (ref 0.44–1.00)
GFR calc Af Amer: 44 mL/min — ABNORMAL LOW (ref >60)
GFR calc non Af Amer: 38 mL/min — ABNORMAL LOW (ref >60)
GLUCOSE: 108 mg/dL — AB (ref 65–99)
POTASSIUM: 4 mmol/L (ref 3.5–5.1)
Sodium: 130 mmol/L — ABNORMAL LOW (ref 135–145)

## 2015-10-15 LAB — URINE MICROSCOPIC-ADD ON

## 2015-10-15 LAB — TROPONIN I

## 2015-10-15 LAB — CBG MONITORING, ED: Glucose-Capillary: 83 mg/dL (ref 65–99)

## 2015-10-15 MED ORDER — SODIUM CHLORIDE 0.9 % IV BOLUS (SEPSIS)
1000.0000 mL | Freq: Once | INTRAVENOUS | Status: AC
  Administered 2015-10-15: 1000 mL via INTRAVENOUS

## 2015-10-15 MED ORDER — ONDANSETRON HCL 4 MG PO TABS: 4.0000 mg | ORAL_TABLET | Freq: Four times a day (QID) | ORAL | Status: DC | PRN

## 2015-10-15 MED ORDER — SODIUM CHLORIDE 0.9% FLUSH
3.0000 mL | Freq: Two times a day (BID) | INTRAVENOUS | Status: DC
  Administered 2015-10-15: 3 mL via INTRAVENOUS

## 2015-10-15 MED ORDER — POLYETHYLENE GLYCOL 3350 17 G PO PACK: 17.0000 g | PACK | Freq: Every day | ORAL | Status: DC | PRN

## 2015-10-15 MED ORDER — GABAPENTIN 300 MG PO CAPS
300.0000 mg | ORAL_CAPSULE | Freq: Two times a day (BID) | ORAL | Status: DC
  Administered 2015-10-16 (×2): 300 mg via ORAL
  Filled 2015-10-15 (×2): qty 1

## 2015-10-15 MED ORDER — ACETAMINOPHEN 650 MG RE SUPP: 650.0000 mg | Freq: Four times a day (QID) | RECTAL | Status: DC | PRN

## 2015-10-15 MED ORDER — ONDANSETRON HCL 4 MG/2ML IJ SOLN
4.0000 mg | Freq: Four times a day (QID) | INTRAMUSCULAR | Status: DC | PRN
Start: 2015-10-15 — End: 2015-10-16

## 2015-10-15 MED ORDER — SODIUM CHLORIDE 0.9 % IV SOLN
INTRAVENOUS | Status: DC
  Administered 2015-10-16: via INTRAVENOUS

## 2015-10-15 MED ORDER — LEVOTHYROXINE SODIUM 100 MCG PO TABS
100.0000 ug | ORAL_TABLET | Freq: Every day | ORAL | Status: DC
  Administered 2015-10-16: 100 ug via ORAL
  Filled 2015-10-15: qty 1

## 2015-10-15 MED ORDER — VITAMIN D (ERGOCALCIFEROL) 1.25 MG (50000 UNIT) PO CAPS
50000.0000 [IU] | ORAL_CAPSULE | ORAL | Status: DC
  Administered 2015-10-16: 50000 [IU] via ORAL
  Filled 2015-10-15: qty 1

## 2015-10-15 MED ORDER — ASPIRIN EC 81 MG PO TBEC
81.0000 mg | DELAYED_RELEASE_TABLET | Freq: Every day | ORAL | Status: DC
  Administered 2015-10-16: 81 mg via ORAL
  Filled 2015-10-15: qty 1

## 2015-10-15 MED ORDER — AMLODIPINE BESYLATE 5 MG PO TABS
5.0000 mg | ORAL_TABLET | Freq: Every day | ORAL | Status: DC
Start: 2015-10-16 — End: 2015-10-16
  Administered 2015-10-16: 5 mg via ORAL
  Filled 2015-10-15: qty 1

## 2015-10-15 MED ORDER — HYDROCODONE-ACETAMINOPHEN 5-325 MG PO TABS: 1.0000 | ORAL_TABLET | ORAL | Status: DC | PRN

## 2015-10-15 MED ORDER — ENOXAPARIN SODIUM 40 MG/0.4ML ~~LOC~~ SOLN
40.0000 mg | SUBCUTANEOUS | Status: DC
  Administered 2015-10-16: 40 mg via SUBCUTANEOUS
  Filled 2015-10-15: qty 0.4

## 2015-10-15 MED ORDER — ACETAMINOPHEN 325 MG PO TABS: 650.0000 mg | ORAL_TABLET | Freq: Four times a day (QID) | ORAL | Status: DC | PRN

## 2015-10-15 NOTE — H&P (Signed)
History and Physical    Jeanette Brady [id number] DOB: Sep 30, 1929 DOA: 10/15/2015  PCP: Rosita Fire, MD   Patient coming from: Home   Chief Complaint: Syncope  HPI: Jeanette Brady is a 82 y.o. female with medical history significant for hypertension, hypothyroidism, and osteoarthritis who presents the emergency department following a syncopal episode at home. Patient reports waking this morning in her usual state of health and attended a funeral before returning home. She did experience some mild lightheadedness towards the end of the funeral, but there was no headache, chest pain, or palpitations. Tach at home, she was seated on the couch and talking with friends and family who when she became very lethargic and was noted to slump over. She was not responsive during this episode which lasted for approximately 2 minutes per report. She eventually regained consciousness and quickly regained her orientation. There were no jerking movement observed, no head strike or injury, no incontinence, and no postictal period. Patient has never experienced similar symptoms previously. She denies any recent headache, change in vision or hearing, loss of coordination, or focal numbness or weakness. She denies any lower extremity edema or tenderness, palpitations, chest pain, or hemoptysis.  ED Course: Upon arrival to the ED, patient is found to be afebrile, saturating well on room air, and with vital signs otherwise stable. There is a 30 mmHg drop in systolic blood pressure from lying to standing. Chemistry panel features a sodium of 1:30 and serum creatinine 1.26, up from 0.9 one year ago. CBC is notable for a stable normocytic anemia with hemoglobin of 11.6. Troponin is undetectable and urinalysis is unremarkable. EKG features a normal sinus rhythm. Patient was given a 1 L normal saline bolus in the emergency department. There were no apparent arrhythmias on telemetry monitoring while in the ED and the patient  remained hemodynamically stable. She'll be observed on the telemetry unit for ongoing evaluation and management of syncope.  Review of Systems:  All other systems reviewed and apart from HPI, are negative.  Past Medical History:  Diagnosis Date  . Arthritis   . Hypertension     Past Surgical History:  Procedure Laterality Date  . BACK SURGERY    . REPLACEMENT TOTAL KNEE BILATERAL    . YAG LASER APPLICATION Right [id number]   Procedure: YAG LASER APPLICATION;  Surgeon: Rutherford Guys, MD;  Location: [facility];  Service: Ophthalmology;  Laterality: Right;     reports that she quit smoking about 22 years ago. Her smoking use included Cigarettes. She has a 1.00 pack-year smoking history. Her smokeless tobacco use includes Snuff. She reports that she does not drink alcohol or use drugs.  No Known Allergies  Family History  Problem Relation Age of Onset  . Heart attack Mother      Prior to Admission medications   Medication Sig Start Date End Date Taking? Authorizing Provider  amLODipine (NORVASC) 5 MG tablet Take 5 mg by mouth daily. 10/12/15  Yes Historical Provider, MD  aspirin EC 81 MG tablet Take 81 mg by mouth daily.   Yes Historical Provider, MD  gabapentin (NEURONTIN) 300 MG capsule Take 300 mg by mouth 2 (two) times daily.  05/26/14  Yes Historical Provider, MD  hydrochlorothiazide (HYDRODIURIL) 25 MG tablet Take 25 mg by mouth daily. 10/09/15  Yes Historical Provider, MD  losartan (COZAAR) 100 MG tablet Take 100 mg by mouth daily. 10/09/15  Yes Historical Provider, MD  SYNTHROID 100 MCG tablet Take 1 tablet by mouth daily. 11/29/13  Yes Historical Provider, MD  traMADol-acetaminophen (ULTRACET) 37.5-325 MG tablet Take 1 tablet by mouth 2 (two) times daily. 08/18/15  Yes Historical Provider, MD  Vitamin D, Ergocalciferol, (DRISDOL) 50000 units CAPS capsule Take 50,000 Units by mouth every 7 (seven) days.   Yes Historical Provider, MD  oxyCODONE-acetaminophen (PERCOCET/ROXICET) 5-325 MG per  tablet Take 1 tablet by mouth every 8 (eight) hours as needed for severe pain. Patient not taking: Reported on 10/15/2015 10/31/14   Ripley Fraise, MD    Physical Exam: Vitals:   10/15/15 2145 10/15/15 2200 10/15/15 2236 10/15/15 2245  BP: 164/65 159/60 183/70 161/62  Pulse: 63 66 79 68  Resp: 21 22 17 24   Temp:   98.4 F (36.9 C)   TempSrc:      SpO2: 100% 100% 100% 100%      Constitutional: NAD, calm, comfortable Eyes: PERTLA, lids and conjunctivae normal ENMT: Mucous membranes are dry. Posterior pharynx clear of any exudate or lesions.   Neck: normal, supple, no masses, no thyromegaly Respiratory: clear to auscultation bilaterally, no wheezing, no crackles. Normal respiratory effort.  Cardiovascular: S1 & S2 heard, regular rate and rhythm, grade 3 holosystolic murmur at LSB. No extremity edema. No significant JVD. Abdomen: No distension, no tenderness, no masses palpated. Bowel sounds normal.  Musculoskeletal: no clubbing / cyanosis. No joint deformity upper and lower extremities. Normal muscle tone.  Skin: no significant rashes, lesions, ulcers. Warm, dry, well-perfused. Neurologic: CN 2-12 grossly intact. Sensation intact, DTR normal. Strength 5/5 in all 4 limbs.  Psychiatric: Normal judgment and insight. Alert and oriented x 3. Normal mood and affect.     Labs on Admission: I have personally reviewed following labs and imaging studies  CBC:  Recent Labs Lab 10/15/15 2021  WBC 7.2  HGB 11.6*  HCT 35.5*  MCV 89.6  PLT 123456   Basic Metabolic Panel:  Recent Labs Lab 10/15/15 2021  NA 130*  K 4.0  CL 97*  CO2 26  GLUCOSE 108*  BUN 16  CREATININE 1.26*  CALCIUM 10.8*   GFR: CrCl cannot be calculated (Unknown ideal weight.). Liver Function Tests: No results for input(s): AST, ALT, ALKPHOS, BILITOT, PROT, ALBUMIN in the last 168 hours. No results for input(s): LIPASE, AMYLASE in the last 168 hours. No results for input(s): AMMONIA in the last 168  hours. Coagulation Profile: No results for input(s): INR, PROTIME in the last 168 hours. Cardiac Enzymes:  Recent Labs Lab 10/15/15 2044  TROPONINI <0.03   BNP (last 3 results) No results for input(s): PROBNP in the last 8760 hours. HbA1C: No results for input(s): HGBA1C in the last 72 hours. CBG:  Recent Labs Lab 10/15/15 2106  GLUCAP 83   Lipid Profile: No results for input(s): CHOL, HDL, LDLCALC, TRIG, CHOLHDL, LDLDIRECT in the last 72 hours. Thyroid Function Tests: No results for input(s): TSH, T4TOTAL, FREET4, T3FREE, THYROIDAB in the last 72 hours. Anemia Panel: No results for input(s): VITAMINB12, FOLATE, FERRITIN, TIBC, IRON, RETICCTPCT in the last 72 hours. Urine analysis:    Component Value Date/Time   COLORURINE YELLOW 10/15/2015 2048   APPEARANCEUR CLOUDY (A) 10/15/2015 2048   LABSPEC 1.017 10/15/2015 2048   PHURINE 7.0 10/15/2015 2048   GLUCOSEU NEGATIVE 10/15/2015 2048   HGBUR NEGATIVE 10/15/2015 2048   BILIRUBINUR NEGATIVE 10/15/2015 2048   KETONESUR NEGATIVE 10/15/2015 2048   PROTEINUR NEGATIVE 10/15/2015 2048   UROBILINOGEN 0.2 10/31/2014 1143   NITRITE NEGATIVE 10/15/2015 2048   LEUKOCYTESUR SMALL (A) 10/15/2015 2048   Sepsis Labs: @  LABRCNTIP(procalcitonin:4,lacticidven:4) )No results found for this or any previous visit (from the past 240 hour(s)).   Radiological Exams on Admission: No results found.  EKG: Independently reviewed. Normal sinus rhythm  Assessment/Plan  1. Syncope  - She has positive orthostatic vitals, but the episode reportedly occurred while seated on a couch - Monitor on telemetry for arrhythmia; check TTE for structural cardiac etiology  - Fluid-resuscitate as below   2. Orthostatic hypotension  - SBP dropped 30 mmHg upon standing in ED  - Pt appeared dehydrated on arrival and she is receiving IVF  - May also be an element of autonomic dysfunction playing a role  - Hold antihypertensives for now while hydrating;  repeat orthostatic vitals in am    3. Kidney disease of uncertain chronicity  - SCr 1.26 on admission, up from 0.91 one yr ago  - With apparent dehydration on admission, there is likely an acute component to this - Hold losartan, avoid nephrotoxins  - Repeat chem panel in am   4. Hypertension  - At goal currently  - Managed with losartan and HCTZ at home; holding these in setting of dehydration and electrolyte disturbance  - Treat with hydralazine IVP's prn for now   5. Hypothyroidism  - Appears stable  - Continue current-dose Synthroid    6. Hyponatremia - Serum sodium 130 on admission in setting of dehydration  - Anticipate improvement with IVF hydration  - Repeat chem panel in am    DVT prophylaxis: sq Lovenox  Code Status: Full  Family Communication: Discussed with patient Disposition Plan: Observe on telemetry Consults called: None Admission status: Observation    Vianne Bulls, MD [facility] Pager [phone]  If 7PM-7AM, please contact night-coverage [email or web address] Password [facility]  10/15/2015, 11:09 PM

## 2015-10-15 NOTE — ED Notes (Signed)
MD at bedside. 

## 2015-10-15 NOTE — ED Triage Notes (Signed)
Pt arrived to ED via EMS from home. C/o increase in dizziness today after returning from a funeral. No fall or LOC reported. Reported lethargic upon EMS arrival. Currently alert. Tremors observed/ Vitals 192/66, 98% on room air. No c/o nausea, vomiting or diarrhea, Denies any pain.
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
## 2015-10-15 NOTE — ED Notes (Signed)
Pt up to bed-side commode with assistance.

## 2015-10-15 NOTE — ED Notes (Signed)
Hospitalist MD at bedside. 

## 2015-10-16 ENCOUNTER — Encounter (HOSPITAL_COMMUNITY): Payer: Self-pay | Admitting: Family Medicine

## 2015-10-16 ENCOUNTER — Observation Stay (HOSPITAL_COMMUNITY): Payer: Commercial Managed Care - HMO

## 2015-10-16 DIAGNOSIS — E039 Hypothyroidism, unspecified: Secondary | ICD-10-CM | POA: Diagnosis not present

## 2015-10-16 DIAGNOSIS — R011 Cardiac murmur, unspecified: Secondary | ICD-10-CM | POA: Diagnosis not present

## 2015-10-16 DIAGNOSIS — R55 Syncope and collapse: Secondary | ICD-10-CM | POA: Diagnosis not present

## 2015-10-16 DIAGNOSIS — I10 Essential (primary) hypertension: Secondary | ICD-10-CM | POA: Diagnosis not present

## 2015-10-16 LAB — TSH: TSH: 0.372 u[IU]/mL (ref 0.350–4.500)

## 2015-10-16 LAB — BASIC METABOLIC PANEL
ANION GAP: 7 (ref 5–15)
BUN: 13 mg/dL (ref 6–20)
CHLORIDE: 99 mmol/L — AB (ref 101–111)
CO2: 24 mmol/L (ref 22–32)
Calcium: 9.4 mg/dL (ref 8.9–10.3)
Creatinine, Ser: 0.91 mg/dL (ref 0.44–1.00)
GFR, EST NON AFRICAN AMERICAN: 56 mL/min — AB (ref >60)
Glucose, Bld: 99 mg/dL (ref 65–99)
POTASSIUM: 3.5 mmol/L (ref 3.5–5.1)
SODIUM: 130 mmol/L — AB (ref 135–145)

## 2015-10-16 LAB — TROPONIN I
Troponin I: <0.03 ng/mL (ref <0.03)
Troponin I: <0.03 ng/mL (ref <0.03)

## 2015-10-16 LAB — GLUCOSE, CAPILLARY: GLUCOSE-CAPILLARY: 92 mg/dL (ref 65–99)

## 2015-10-16 MED ORDER — HYDRALAZINE HCL 20 MG/ML IJ SOLN: 10.0000 mg | INTRAMUSCULAR | Status: DC | PRN

## 2015-10-16 NOTE — Evaluation (Signed)
Physical Therapy Evaluation Patient Details Name: Jeanette Brady MRN: [id number] DOB: Jul 05, 1929 Today's Date: 10/16/2015   History of Present Illness  Pt is a 82 y/o F who presented to the ED following a syncopal episode at home, lasting ~2 minutes. +Orthostatic vitals. Pt's PMH includes back surgery, Bil TKA.      Clinical Impression  Pt admitted with above diagnosis. Pt currently with functional limitations due to the deficits listed below (see PT Problem List). Jeanette Brady requires assist for ADLs, IADLs from her husband PTA and uses cane when ambulating.  Pt currently requires supervision for safety while ambulating and denies dizziness throughout session. Pt will benefit from skilled PT to increase their independence and safety with mobility to allow discharge to the venue listed below.      Follow Up Recommendations Home health PT;Supervision for mobility/OOB    Equipment Recommendations  None recommended by PT    Recommendations for Other Services OT consult     Precautions / Restrictions Precautions Precautions: Fall Precaution Comments: Syncope reason for admit Restrictions Weight Bearing Restrictions: No      Mobility  Bed Mobility Overal bed mobility: Modified Independent             General bed mobility comments: Increased time with HOB elevated  Transfers Overall transfer level: Needs assistance Equipment used: Rolling walker (2 wheeled) Transfers: Sit to/from Stand Sit to Stand: Supervision         General transfer comment: Supervision for safety.  Pt denies dizziness with position change.    Ambulation/Gait Ambulation/Gait assistance: Supervision Ambulation Distance (Feet): 100 Feet Assistive device: Rolling walker (2 wheeled) Gait Pattern/deviations: Step-through pattern;Decreased stride length;Trunk flexed Gait velocity: decreased   General Gait Details: Decreased gait speed.  Pt denies dizziness, HR in 90s.  Supervision for safety.  Used RW  as pt typically uses cane at home but did not bring it to hospital.  Stairs            Wheelchair Mobility    Modified Rankin (Stroke Patients Only)       Balance Overall balance assessment: Needs assistance Sitting-balance support: No upper extremity supported;Feet supported Sitting balance-Leahy Scale: Good     Standing balance support: No upper extremity supported;During functional activity Standing balance-Leahy Scale: Fair Standing balance comment: Pt able to stand at bedside with supervision and no UE support                             Pertinent Vitals/Pain Pain Assessment: No/denies pain    Home Living Family/patient expects to be discharged to:: Private residence Living Arrangements: Spouse/significant other Available Help at Discharge: Family;Available 24 hours/day Type of Home: Mobile home Home Access: Stairs to enter Entrance Stairs-Rails: Left;Right;Can reach both Entrance Stairs-Number of Steps: 4 Home Layout: One level Home Equipment: Walker - 2 wheels;Cane - single point;Bedside commode;Shower seat;Wheelchair - manual      Prior Function Level of Independence: Needs assistance   Gait / Transfers Assistance Needed: Husband assists with tub transfer.  Pt uses cane at all times to ambulate, usually limited to household distances.  Reports one fall ~6 months ago, "I just got weak and fell"  ADL's / Homemaking Assistance Needed: Assist from her husband for bathing, dressing, cooking, cleaning.        Hand Dominance   Dominant Hand: Right    Extremity/Trunk Assessment   Upper Extremity Assessment: RUE deficits/detail;LUE deficits/detail RUE Deficits / Details: Shoulder flexion limited  to ~80 deg due to pain and arthritis  RUE: Unable to fully assess due to pain       Lower Extremity Assessment: LLE deficits/detail   LLE Deficits / Details: strength grossly 4/5  Cervical / Trunk Assessment: Kyphotic  Communication    Communication: No difficulties  Cognition Arousal/Alertness: Awake/alert Behavior During Therapy: WFL for tasks assessed/performed Overall Cognitive Status: Within Functional Limits for tasks assessed                      General Comments      Exercises        Assessment/Plan    PT Assessment Patient needs continued PT services  PT Diagnosis Difficulty walking   PT Problem List Decreased strength;Decreased activity tolerance;Decreased balance;Decreased knowledge of use of DME;Decreased safety awareness  PT Treatment Interventions DME instruction;Gait training;Stair training;Functional mobility training;Therapeutic activities;Therapeutic exercise;Balance training;Patient/family education   PT Goals (Current goals can be found in the Care Plan section) Acute Rehab PT Goals Patient Stated Goal: to go home PT Goal Formulation: With patient Time For Goal Achievement: 10/30/15 Potential to Achieve Goals: Good    Frequency Min 3X/week   Barriers to discharge        Co-evaluation               End of Session Equipment Utilized During Treatment: Gait belt Activity Tolerance: Patient tolerated treatment well Patient left: in chair;with call bell/phone within reach;with chair alarm set Nurse Communication: Mobility status    Functional Assessment Tool Used: Clinical Judgement Functional Limitation: Mobility: Walking and moving around Mobility: Walking and Moving Around Current Status [phone]): At least 1 percent but less than 20 percent impaired, limited or restricted Mobility: Walking and Moving Around Goal Status [phone]): At least 1 percent but less than 20 percent impaired, limited or restricted    Time: 0930-0947 PT Time Calculation (min) (ACUTE ONLY): 17 min   Charges:   PT Evaluation $PT Eval Low Complexity: 1 Procedure     PT G Codes:   PT G-Codes **NOT FOR INPATIENT CLASS** Functional Assessment Tool Used: Clinical Judgement Functional Limitation:  Mobility: Walking and moving around Mobility: Walking and Moving Around Current Status [id number]): At least 1 percent but less than 20 percent impaired, limited or restricted Mobility: Walking and Moving Around Goal Status [phone]): At least 1 percent but less than 20 percent impaired, limited or restricted   Collie Siad PT, DPT  Pager: [phone] Phone: [phone] 10/16/2015, 10:06 AM

## 2015-10-17 NOTE — Discharge Summary (Signed)
Discharge Summary  Jeanette Brady [id number] DOB: 1930/03/02  PCP: Rosita Fire, MD  Admit date: 10/15/2015 Discharge date: 10/17/2015   Recommendations for Outpatient Follow-up:  1. PCP within 1 week with outpatient Echo.   Discharge Diagnoses:  Active Hospital Problems   Diagnosis Date Noted  . Syncope 10/15/2015  . Hypertension 10/15/2015  . Hypothyroidism 10/15/2015  . Heart murmur 10/15/2015  . Orthostatic hypotension 10/15/2015  . Kidney disease 10/15/2015  . Normocytic anemia 10/15/2015    Resolved Hospital Problems   Diagnosis Date Noted Date Resolved  No resolved problems to display.    Discharge Condition: Stable   Diet recommendation: Regular, heart healthy.   Vitals:   10/16/15 0549 10/16/15 1228  BP: (!) 159/51 (!) 131/52  Pulse: 61 61  Resp: 18 18  Temp: 98.2 F (36.8 C) 98.1 F (36.7 C)    History of present illness:  Jeanette Brady is a 82 y.o. female with medical history significant for hypertension, hypothyroidism, and osteoarthritis who presents the emergency department following a syncopal episode at home. Patient reports waking this morning in her usual state of health and attended a funeral before returning home. She did experience some mild lightheadedness towards the end of the funeral, but there was no headache, chest pain, or palpitations.   Hospital Course:  Principal Problem:   Syncope Active Problems:   Hypertension   Hypothyroidism   Heart murmur   Orthostatic hypotension   Kidney disease   Normocytic anemia  Jeanette Brady was admitted to telemetry under observation status and given IV fluid hydration. She had no episodes of arrhythmia noted on telemetry. Her renal function improved to baseline with IVF, and home BP meds were held at admission. Orthostatics were checked on the day of discharge and were normal. She felt well and requested discharge home. Home health PT has been arranged per PT evaluation done prior to discharge  home. Echo was not done prior to discharge home, and her PCP office will be contacted on Monday 8/14 to request consideration of outpatient Echo to be done to evaluate for any structural heart disease.  Procedures:  None   Consultations:  None   Discharge Exam: BP (!) 131/52 (BP Location: Right Arm)   Pulse 61   Temp 98.1 F (36.7 C) (Oral)   Resp 18   Ht 5' (1.524 m)   Wt 60.5 kg (133 lb 4.8 oz)   SpO2 98%   BMI 26.03 kg/m  General:  Alert, oriented, calm, in no acute distress  Eyes: pupils round and reactive to light and accomodation, clear sclerea Neck: supple, no masses, trachea mildline  Cardiovascular: RRR, no murmurs or rubs, no peripheral edema  Respiratory: clear to auscultation bilaterally, no wheezes, no crackles  Abdomen: soft, nontender, nondistended, normal bowel tones heard  Skin: dry, no rashes  Musculoskeletal: no joint effusions, normal range of motion  Psychiatric: appropriate affect, normal speech  Neurologic: extraocular muscles intact, clear speech, moving all extremities with intact sensorium    Discharge Instructions You were cared for by a hospitalist during your hospital stay. If you have any questions about your discharge medications or the care you received while you were in the hospital after you are discharged, you can call the unit and asked to speak with the hospitalist on call if the hospitalist that took care of you is not available. Once you are discharged, your primary care physician will handle any further medical issues. Please note that NO REFILLS for any discharge medications will  be authorized once you are discharged, as it is imperative that you return to your primary care physician (or establish a relationship with a primary care physician if you do not have one) for your aftercare needs so that they can reassess your need for medications and monitor your lab values.  Discharge Instructions    Diet - low sodium heart healthy    Complete  by:  As directed   Increase activity slowly    Complete by:  As directed       Medication List    STOP taking these medications   hydrochlorothiazide 25 MG tablet Commonly known as:  HYDRODIURIL   losartan 100 MG tablet Commonly known as:  COZAAR   oxyCODONE-acetaminophen 5-325 MG tablet Commonly known as:  PERCOCET/ROXICET     TAKE these medications   amLODipine 5 MG tablet Commonly known as:  NORVASC Take 5 mg by mouth daily.   aspirin EC 81 MG tablet Take 81 mg by mouth daily.   gabapentin 300 MG capsule Commonly known as:  NEURONTIN Take 300 mg by mouth 2 (two) times daily.   SYNTHROID 100 MCG tablet Generic drug:  levothyroxine Take 1 tablet by mouth daily.   traMADol-acetaminophen 37.5-325 MG tablet Commonly known as:  ULTRACET Take 1 tablet by mouth 2 (two) times daily.   Vitamin D (Ergocalciferol) 50000 units Caps capsule Commonly known as:  DRISDOL Take 50,000 Units by mouth every 7 (seven) days.      No Known Allergies Follow-up Information    FANTA,TESFAYE, MD Follow up in 2 week(s).   Specialty:  Internal Medicine Contact information: [place] [address] [phone]        [place].   Specialty:  Cardiac Intensive Care Why:  Needs an ECHO done .Marland Kitchen Please make an appointment  Contact information: [address] [id number] [place] [phone]           The results of significant diagnostics from this hospitalization (including imaging, microbiology, ancillary and laboratory) are listed below for reference.    Significant Diagnostic Studies: X-ray Chest Pa And Lateral  Result Date: 10/16/2015 CLINICAL DATA:  Patient with syncope. EXAM: CHEST  2 VIEW COMPARISON:  Chest radiograph 12/30/2008 FINDINGS: Patient is rotated. Monitoring leads overlie the patient. Stable cardiac and mediastinal contours. Low lung volumes. Pulmonary  vascular redistribution. No large area of pulmonary consolidation. No pleural effusion or pneumothorax. Thoracic spine and bilateral shoulder joint degenerative changes. IMPRESSION: Mild pulmonary vascular redistribution. Low lung volumes. Electronically Signed   By: Lovey Newcomer M.D.   On: 10/16/2015 07:28    Microbiology: No results found for this or any previous visit (from the past 240 hour(s)).   Labs: Basic Metabolic Panel:  Recent Labs Lab 10/15/15 2021 10/16/15 0515  NA 130* 130*  K 4.0 3.5  CL 97* 99*  CO2 26 24  GLUCOSE 108* 99  BUN 16 13  CREATININE 1.26* 0.91  CALCIUM 10.8* 9.4   Liver Function Tests: No results for input(s): AST, ALT, ALKPHOS, BILITOT, PROT, ALBUMIN in the last 168 hours. No results for input(s): LIPASE, AMYLASE in the last 168 hours. No results for input(s): AMMONIA in the last 168 hours. CBC:  Recent Labs Lab 10/15/15 2021  WBC 7.2  HGB 11.6*  HCT 35.5*  MCV 89.6  PLT 260   Cardiac Enzymes:  Recent Labs Lab 10/15/15 2044 10/15/15 2326 10/16/15 0515 10/16/15 1108  TROPONINI <0.03 <0.03 <  0.03 <0.03   BNP: BNP (last 3 results) No results for input(s): BNP in the last 8760 hours.  ProBNP (last 3 results) No results for input(s): PROBNP in the last 8760 hours.  CBG:  Recent Labs Lab 10/15/15 2106 10/16/15 0551  GLUCAP 83 92    Time spent: 23 minutes were spent in preparing this discharge including medication reconciliation, counseling, and coordination of care.  Signed:  [employer]  [facility] 10/17/2015, 3:20 PM

## 2015-10-22 ENCOUNTER — Other Ambulatory Visit (HOSPITAL_COMMUNITY): Payer: Self-pay | Admitting: Internal Medicine

## 2015-10-22 DIAGNOSIS — R55 Syncope and collapse: Secondary | ICD-10-CM

## 2015-11-03 ENCOUNTER — Ambulatory Visit (HOSPITAL_COMMUNITY)
Admission: RE | Admit: 2015-11-03 | Discharge: 2015-11-03 | Disposition: A | Discharge status: Home / Self Care | Payer: Commercial Managed Care - HMO | Source: Ambulatory Visit | Attending: Internal Medicine | Admitting: Internal Medicine

## 2015-11-03 DIAGNOSIS — R55 Syncope and collapse: Secondary | ICD-10-CM | POA: Diagnosis present

## 2015-11-03 DIAGNOSIS — I351 Nonrheumatic aortic (valve) insufficiency: Secondary | ICD-10-CM | POA: Diagnosis not present

## 2015-11-03 DIAGNOSIS — I119 Hypertensive heart disease without heart failure: Secondary | ICD-10-CM | POA: Insufficient documentation

## 2015-11-03 LAB — ECHOCARDIOGRAM COMPLETE
AO mean calculated velocity dopler: 116 cm/s
AOPV: 0.84 m/s
AOVTI: 38.2 cm
AV Area VTI index: 1.01 cm2/m2
AV Area VTI: 1.68 cm2
AV Area mean vel: 1.55 cm2
AV Mean grad: 6 mmHg
AV area mean vel ind: 0.96 cm2/m2
AV peak Index: 1.04
AV vel: 1.64
AVA: 1.64 cm2
AVCELMEANRAT: 0.77
AVLVOTPG: 8 mmHg
AVPG: 12 mmHg
AVPKVEL: 170 cm/s
CHL CUP RV SYS PRESS: 32 mmHg
E decel time: 377 msec
E/e' ratio: 8.87
FS: 36 % (ref 28–44)
IV/PV OW: 1.38
LA ID, A-P, ES: 29 mm
LA diam end sys: 29 mm
LA vol: 50.6 mL
LADIAMINDEX: 1.79 cm/m2
LAVOLA4C: 44.1 mL
LAVOLIN: 31.3 mL/m2
LDCA: 2.01 cm2
LV E/e' medial: 8.87
LV PW d: 9.56 mm — AB (ref 0.6–1.1)
LV TDI E'LATERAL: 8.59
LV TDI E'MEDIAL: 7.51
LV e' LATERAL: 8.59 cm/s
LVDIAVOL: 42 mL — AB (ref 46–106)
LVDIAVOLIN: 26 mL/m2
LVEEAVG: 8.87
LVOT VTI: 31.1 cm
LVOT diameter: 16 mm
LVOT peak VTI: 0.81 cm
LVOT peak vel: 142 cm/s
LVOTSV: 63 mL
LVSYSVOL: 13 mL — AB (ref 14–42)
LVSYSVOLIN: 8 mL/m2
Lateral S' vel: 13.3 cm/s
MV Dec: 377
MV pk A vel: 122 m/s
MV pk E vel: 76.2 m/s
MVPG: 2 mmHg
RV TAPSE: 22.6 mm
Reg peak vel: 269 cm/s
Simpson's disk: 70
Stroke v: 30 ml
TR max vel: 269 cm/s
Valve area index: 1.01

## 2015-11-03 NOTE — Progress Notes (Signed)
*  PRELIMINARY RESULTS* Echocardiogram 2D Echocardiogram has been performed.  Jeanette Brady 11/03/2015, 1:45 PM

## 2016-01-25 ENCOUNTER — Ambulatory Visit (INDEPENDENT_AMBULATORY_CARE_PROVIDER_SITE_OTHER): Payer: PPO | Admitting: Family Medicine

## 2016-01-25 ENCOUNTER — Encounter: Payer: Self-pay | Admitting: Family Medicine

## 2016-01-25 VITALS — BP 138/72 | HR 88 | Temp 97.4°F | Resp 16 | Ht 60.0 in | Wt 135.1 lb

## 2016-01-25 DIAGNOSIS — M150 Primary generalized (osteo)arthritis: Secondary | ICD-10-CM

## 2016-01-25 DIAGNOSIS — E038 Other specified hypothyroidism: Secondary | ICD-10-CM

## 2016-01-25 DIAGNOSIS — M25511 Pain in right shoulder: Secondary | ICD-10-CM | POA: Diagnosis not present

## 2016-01-25 DIAGNOSIS — D649 Anemia, unspecified: Secondary | ICD-10-CM

## 2016-01-25 DIAGNOSIS — I10 Essential (primary) hypertension: Secondary | ICD-10-CM | POA: Diagnosis not present

## 2016-01-25 DIAGNOSIS — M25512 Pain in left shoulder: Secondary | ICD-10-CM

## 2016-01-25 DIAGNOSIS — M159 Polyosteoarthritis, unspecified: Secondary | ICD-10-CM

## 2016-01-25 DIAGNOSIS — Z86011 Personal history of benign neoplasm of the brain: Secondary | ICD-10-CM | POA: Insufficient documentation

## 2016-01-25 MED ORDER — TRAMADOL-ACETAMINOPHEN 37.5-325 MG PO TABS: 1.0000 | ORAL_TABLET | ORAL | 0 refills | Status: DC | PRN

## 2016-01-25 NOTE — Patient Instructions (Signed)
May take aleve twice a day with food for arthritis pain If pain severe, take the tramadol  See me in February for follow up Need lab tests prior to visit  Call for problems

## 2016-01-25 NOTE — Progress Notes (Signed)
Chief Complaint  Patient presents with  . Establish Care   New to establish 82 year old lives at home with husband Limited by osteoarthritis Otherwise feels she is healthy Compliant with medicines One recent fall at home, landed on buttocks, not hurt BP controlled Recent hospital stay August for syncope with negative workup Has never had a mammogram ( refused) colonoscopy negative a few years back Has had flu shot and pneumonia vaccine, declines shot for shingles due to cost Uncertain tetanus, will ask for old records   Patient Active Problem List   Diagnosis Date Noted  . Primary osteoarthritis involving multiple joints 01/25/2016  . Syncope 10/15/2015  . Hypertension 10/15/2015  . Hypothyroidism 10/15/2015  . Heart murmur 10/15/2015  . Kidney disease 10/15/2015  . Normocytic anemia 10/15/2015  . SHOULDER PAIN 06/09/2008    Outpatient Encounter Prescriptions as of 01/25/2016  Medication Sig  . alendronate (FOSAMAX) 70 MG tablet Take 70 mg by mouth once a week. Take with a full glass of water on an empty stomach.  Marland Kitchen amLODipine (NORVASC) 5 MG tablet Take 5 mg by mouth daily.  Marland Kitchen aspirin EC 81 MG tablet Take 81 mg by mouth daily.  Marland Kitchen gabapentin (NEURONTIN) 300 MG capsule Take 300 mg by mouth 2 (two) times daily.   . hydrochlorothiazide (HYDRODIURIL) 25 MG tablet Take 25 mg by mouth daily.  Marland Kitchen losartan-hydrochlorothiazide (HYZAAR) 100-12.5 MG tablet Take 1 tablet by mouth daily.  Marland Kitchen SYNTHROID 100 MCG tablet Take 1 tablet by mouth daily.  . Vitamin D, Ergocalciferol, (DRISDOL) 50000 units CAPS capsule Take 50,000 Units by mouth every 7 (seven) days.  . Cholecalciferol (VITAMIN D3) 50000 units CAPS Take 1 capsule by mouth once a week.  . traMADol-acetaminophen (ULTRACET) 37.5-325 MG tablet Take 1 tablet by mouth every 4 (four) hours as needed.   No facility-administered encounter medications on file as of 01/25/2016.     Past Medical History:  Diagnosis Date  . Anemia   .  Arthritis   . Cataract   . Chronic kidney disease   . Heart murmur   . Hypertension   . Thyroid disease     Past Surgical History:  Procedure Laterality Date  . BACK SURGERY    . EYE SURGERY    . JOINT REPLACEMENT    . REPLACEMENT TOTAL KNEE BILATERAL    . SPINE SURGERY    . YAG LASER APPLICATION Right [id number]   Procedure: YAG LASER APPLICATION;  Surgeon: Rutherford Guys, MD;  Location: [facility];  Service: Ophthalmology;  Laterality: Right;    Social History   Social History  . Marital status: Married    Spouse name: N/A  . Number of children: N/A  . Years of education: N/A   Occupational History  . Not on file.   Social History Main Topics  . Smoking status: Former Smoker    Packs/day: 0.05    Years: 20.00    Types: Cigarettes    Quit date: 03/06/1993  . Smokeless tobacco: Current User    Types: Snuff  . Alcohol use No  . Drug use: No  . Sexual activity: Not Currently   Other Topics Concern  . Not on file   Social History Narrative  . No narrative on file    Family History  Problem Relation Age of Onset  . Heart attack Mother   . Heart disease Mother   . Kidney disease Mother   . Kidney disease Sister   . Cancer Sister   .  COPD Sister   . Heart disease Sister   . Syncope episode Neg Hx     Review of Systems  Constitutional: Negative for chills, fever and weight loss.  HENT: Positive for nosebleeds. Negative for congestion and hearing loss.   Eyes: Negative for blurred vision and pain.  Respiratory: Negative for cough and shortness of breath.   Cardiovascular: Negative for chest pain and leg swelling.  Gastrointestinal: Negative for abdominal pain, constipation, diarrhea and heartburn.  Genitourinary: Negative for dysuria and frequency.  Musculoskeletal: Positive for back pain, falls and joint pain. Negative for myalgias.  Neurological: Positive for headaches. Negative for dizziness and seizures.  Psychiatric/Behavioral: Negative for depression and  memory loss. The patient is not nervous/anxious and does not have insomnia.     BP 138/72   Pulse 88   Temp 97.4 F (36.3 C) (Oral)   Resp 16   Ht 5' (1.524 m)   Wt 135 lb 1.3 oz (61.3 kg)   SpO2 100%   BMI 26.38 kg/m   Physical Exam  Constitutional: She is oriented to person, place, and time. She appears well-developed and well-nourished. No distress.  Small steps, cautious movements  HENT:  Head: Normocephalic and atraumatic.  Right Ear: External ear normal.  Left Ear: External ear normal.  Mouth/Throat: Oropharynx is clear and moist.  Poor dentition  Eyes: Conjunctivae are normal. Pupils are equal, round, and reactive to light.  Neck: Normal range of motion. No thyromegaly present.  Cardiovascular: Normal rate and regular rhythm.   Murmur heard. Pulmonary/Chest: Effort normal and breath sounds normal. She has no wheezes.  Abdominal: Soft. Bowel sounds are normal. There is no tenderness.  Musculoskeletal:  Both knees replaced.  Both shoulders with very restricted movement, can extend only about 30 degrees.  Arthritis changes hands  Lymphadenopathy:    She has no cervical adenopathy.  Neurological: She is alert and oriented to person, place, and time.  Psychiatric: She has a normal mood and affect. Her behavior is normal. Thought content normal.   ASSESSMENT/PLAN:  1. Essential hypertension  - CBC - COMPLETE METABOLIC PANEL WITH GFR - Lipid panel - TSH - Urinalysis, Routine w reflex microscopic (not at [facility]) - VITAMIN D 25 Hydroxy (Vit-D Deficiency, Fractures)  2. Other specified hypothyroidism  3. Normocytic anemia  4. Pain of both shoulder joints  5. Primary osteoarthritis involving multiple joints   Patient Instructions  May take aleve twice a day with food for arthritis pain If pain severe, take the tramadol  See me in February for follow up Need lab tests prior to visit  Call for problems   Raylene Everts, MD

## 2016-04-04 DIAGNOSIS — E038 Other specified hypothyroidism: Secondary | ICD-10-CM | POA: Diagnosis not present

## 2016-04-04 DIAGNOSIS — M25512 Pain in left shoulder: Secondary | ICD-10-CM | POA: Diagnosis not present

## 2016-04-04 DIAGNOSIS — D649 Anemia, unspecified: Secondary | ICD-10-CM | POA: Diagnosis not present

## 2016-04-04 DIAGNOSIS — M949 Disorder of cartilage, unspecified: Secondary | ICD-10-CM | POA: Diagnosis not present

## 2016-04-04 DIAGNOSIS — M25511 Pain in right shoulder: Secondary | ICD-10-CM | POA: Diagnosis not present

## 2016-04-04 DIAGNOSIS — I10 Essential (primary) hypertension: Secondary | ICD-10-CM | POA: Diagnosis not present

## 2016-04-04 LAB — CBC
HEMATOCRIT: 37.5 % (ref 35.0–45.0)
Hemoglobin: 11.9 g/dL (ref 11.7–15.5)
MCH: 28.7 pg (ref 27.0–33.0)
MCHC: 31.7 g/dL — ABNORMAL LOW (ref 32.0–36.0)
MCV: 90.6 fL (ref 80.0–100.0)
MPV: 10.9 fL (ref 7.5–12.5)
PLATELETS: 293 10*3/uL (ref 140–400)
RBC: 4.14 MIL/uL (ref 3.80–5.10)
RDW: 13.9 % (ref 11.0–15.0)
WBC: 4.8 10*3/uL (ref 3.8–10.8)

## 2016-04-04 LAB — TSH: TSH: 0.51 m[IU]/L

## 2016-04-05 ENCOUNTER — Encounter: Payer: Self-pay | Admitting: Family Medicine

## 2016-04-05 LAB — COMPLETE METABOLIC PANEL WITH GFR
ALT: 5 U/L — ABNORMAL LOW (ref 6–29)
AST: 15 U/L (ref 10–35)
Albumin: 3.6 g/dL (ref 3.6–5.1)
Alkaline Phosphatase: 30 U/L — ABNORMAL LOW (ref 33–130)
BILIRUBIN TOTAL: 0.5 mg/dL (ref 0.2–1.2)
BUN: 15 mg/dL (ref 7–25)
CHLORIDE: 103 mmol/L (ref 98–110)
CO2: 28 mmol/L (ref 20–31)
Calcium: 9.8 mg/dL (ref 8.6–10.4)
Creat: 1.01 mg/dL — ABNORMAL HIGH (ref 0.60–0.88)
GFR, EST AFRICAN AMERICAN: 59 mL/min — AB (ref >=60)
GFR, Est Non African American: 51 mL/min — ABNORMAL LOW (ref >=60)
Glucose, Bld: 105 mg/dL — ABNORMAL HIGH (ref 65–99)
Potassium: 4.6 mmol/L (ref 3.5–5.3)
Sodium: 137 mmol/L (ref 135–146)
TOTAL PROTEIN: 6.6 g/dL (ref 6.1–8.1)

## 2016-04-05 LAB — URINALYSIS, ROUTINE W REFLEX MICROSCOPIC
Bilirubin Urine: NEGATIVE
Glucose, UA: NEGATIVE
HGB URINE DIPSTICK: NEGATIVE
KETONES UR: NEGATIVE
NITRITE: NEGATIVE
PROTEIN: NEGATIVE
Specific Gravity, Urine: 1.01 (ref 1.001–1.035)
pH: 7.5 (ref 5.0–8.0)

## 2016-04-05 LAB — LIPID PANEL
Cholesterol: 210 mg/dL — ABNORMAL HIGH (ref <200)
HDL: 86 mg/dL (ref >50)
LDL CALC: 110 mg/dL — AB (ref <100)
Total CHOL/HDL Ratio: 2.4 Ratio (ref <5.0)
Triglycerides: 69 mg/dL (ref <150)
VLDL: 14 mg/dL (ref <30)

## 2016-04-05 LAB — URINALYSIS, MICROSCOPIC ONLY
Bacteria, UA: NONE SEEN [HPF]
Casts: NONE SEEN [LPF]
Crystals: NONE SEEN [HPF]
RBC / HPF: NONE SEEN RBC/HPF (ref <=2)
Squamous Epithelial / LPF: NONE SEEN [HPF] (ref <=5)
Yeast: NONE SEEN [HPF]

## 2016-04-05 LAB — VITAMIN D 25 HYDROXY (VIT D DEFICIENCY, FRACTURES): VIT D 25 HYDROXY: 85 ng/mL (ref 30–100)

## 2016-04-25 ENCOUNTER — Ambulatory Visit: Payer: PPO | Admitting: Family Medicine

## 2016-05-01 ENCOUNTER — Ambulatory Visit (INDEPENDENT_AMBULATORY_CARE_PROVIDER_SITE_OTHER): Payer: PPO | Admitting: Family Medicine

## 2016-05-01 ENCOUNTER — Encounter: Payer: Self-pay | Admitting: Family Medicine

## 2016-05-01 VITALS — BP 156/70 | HR 72 | Temp 97.6°F | Resp 16 | Ht 60.0 in | Wt 137.0 lb

## 2016-05-01 DIAGNOSIS — M810 Age-related osteoporosis without current pathological fracture: Secondary | ICD-10-CM | POA: Diagnosis not present

## 2016-05-01 DIAGNOSIS — E038 Other specified hypothyroidism: Secondary | ICD-10-CM

## 2016-05-01 DIAGNOSIS — I10 Essential (primary) hypertension: Secondary | ICD-10-CM

## 2016-05-01 DIAGNOSIS — G252 Other specified forms of tremor: Secondary | ICD-10-CM

## 2016-05-01 HISTORY — DX: Age-related osteoporosis without current pathological fracture: M81.0

## 2016-05-01 MED ORDER — GABAPENTIN 300 MG PO CAPS: 300.0000 mg | ORAL_CAPSULE | Freq: Three times a day (TID) | ORAL | 10 refills | Status: DC

## 2016-05-01 NOTE — Progress Notes (Signed)
Chief Complaint  Patient presents with  . Follow-up   Patient is here for follow-up. She had a blood panel done since last seen. Everything was normal as expected. She borderline high cholesterol. Her HDL is also quite high, so I do not feel any specific management is indicated. Her blood pressure is well controlled. Her TSH is therapeutic. She has a history of osteoporosis. She is on Fosamax. She has been taking this since June 2016. Her vitamin D level is 85. I told her to stop her weekly vitamin D and switch instead to an over-the-counter vitamin D to prevent overdose. Her only complaint is "stinging" in her legs. I advised if this is problematic she can increase her gabapentin. She has a intention tremor that make it hard for her to clean, Cook, feed herself.  Patient Active Problem List   Diagnosis Date Noted  . Intention tremor 05/01/2016  . 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 Primary osteoarthritis involving multiple joints 01/25/2016  . Personal history of benign brain tumor 01/25/2016  . Syncope 10/15/2015  . Hypertension 10/15/2015  . Hypothyroidism 10/15/2015  . Heart murmur 10/15/2015  . Kidney disease 10/15/2015  . Normocytic anemia 10/15/2015  . SHOULDER PAIN 06/09/2008    Outpatient Encounter Prescriptions as of 05/01/2016  Medication Sig  . alendronate (FOSAMAX) 70 MG tablet Take 70 mg by mouth once a week. Take with a full glass of water on an empty stomach.  Marland Kitchen amLODipine (NORVASC) 5 MG tablet Take 5 mg by mouth daily.  Marland Kitchen aspirin EC 81 MG tablet Take 81 mg by mouth daily.  Marland Kitchen gabapentin (NEURONTIN) 300 MG capsule Take 1 capsule (300 mg total) by mouth 3 (three) times daily.  Marland Kitchen losartan-hydrochlorothiazide (HYZAAR) 100-12.5 MG tablet Take 1 tablet by mouth daily.  Marland Kitchen SYNTHROID 100 MCG tablet Take 1 tablet by mouth daily.  . traMADol-acetaminophen (ULTRACET) 37.5-325 MG tablet Take 1 tablet by mouth every 4 (four) hours as needed.   No facility-administered  encounter medications on file as of 05/01/2016.     No Known Allergies  Review of Systems  Constitutional: Negative for activity change, appetite change and unexpected weight change.  HENT: Negative for congestion and dental problem.   Eyes: Negative for redness and visual disturbance.  Respiratory: Negative for cough and shortness of breath.   Cardiovascular: Positive for leg swelling. Negative for chest pain and palpitations.       Occasional swelling in legs  Gastrointestinal: Negative for constipation and diarrhea.  Genitourinary: Negative for difficulty urinating.  Musculoskeletal: Negative for arthralgias and back pain.  Neurological: Positive for tremors.  Psychiatric/Behavioral: Negative for confusion, dysphoric mood and sleep disturbance.    BP (!) 156/70 (BP Location: Left Arm, Patient Position: Sitting, Cuff Size: Normal)   Pulse 72   Temp 97.6 F (36.4 C) (Temporal)   Resp 16   Ht 5' (1.524 m)   Wt 137 lb (62.1 kg)   SpO2 99%   BMI 26.76 kg/m   Physical Exam  Constitutional: She is oriented to person, place, and time. She appears well-developed and well-nourished.  Elderly woman. Uses walker. Smiling, cooperative  HENT:  Head: Normocephalic and atraumatic.  Mouth/Throat: Oropharynx is clear and moist.  Eyes: Conjunctivae are normal. Pupils are equal, round, and reactive to light.  Neck: Normal range of motion. Neck supple. No thyromegaly present.  Cardiovascular: Normal rate and regular rhythm.   Murmur heard. Pulmonary/Chest: Effort normal and breath sounds normal. No respiratory distress.  Abdominal: Soft. Bowel  sounds are normal. There is no tenderness.  Musculoskeletal: Normal range of motion. She exhibits edema.  Lymphadenopathy:    She has no cervical adenopathy.  Neurological: She is alert and oriented to person, place, and time.  Gait normal  Skin: Skin is warm and dry.  Psychiatric: She has a normal mood and affect. Her behavior is normal. Thought  content normal.  Nursing note and vitals reviewed.   ASSESSMENT/PLAN:  Essential hypertension  Intention tremor  Osteoporosis of forearm  Other specified hypothyroidism    Patient Instructions  Stop the vitamin D high dose 50000 Take an over the counter vitamin D 2000 u a day Increase the gabapentin to 300 mg three times a day for the "stinging" in your legs  See me in six months Call sooner for problems    Raylene Everts, MD

## 2016-05-01 NOTE — Patient Instructions (Addendum)
Stop the vitamin D high dose 50000 Take an over the counter vitamin D 2000 u a day Increase the gabapentin to 300 mg three times a day for the "stinging" in your legs  See me in six months Call sooner for problems

## 2016-05-25 ENCOUNTER — Ambulatory Visit (INDEPENDENT_AMBULATORY_CARE_PROVIDER_SITE_OTHER): Payer: PPO

## 2016-05-25 VITALS — BP 150/82 | HR 78 | Temp 98.4°F | Ht 60.0 in | Wt 137.1 lb

## 2016-05-25 DIAGNOSIS — Z0000 Encounter for general adult medical examination without abnormal findings: Secondary | ICD-10-CM

## 2016-05-25 NOTE — Progress Notes (Signed)
Subjective:   Jeanette Brady is a 83 y.o. female who presents for Medicare Annual (Subsequent) preventive examination.  Review of Systems:  Cardiac Risk Factors include: advanced age (>36men, >29 women);hypertension;sedentary lifestyle;smoking/ tobacco exposure     Objective:     Vitals: BP (!) 150/82   Pulse 78   Temp 98.4 F (36.9 C) (Oral)   Ht 5' (1.524 m)   Wt 137 lb 1.9 oz (62.2 kg)   SpO2 94%   BMI 26.78 kg/m   Body mass index is 26.78 kg/m.   Tobacco History  Smoking Status  . Former Smoker  . Packs/day: 0.05  . Years: 20.00  . Types: Cigarettes  . Quit date: 03/06/1993  Smokeless Tobacco  . Current User  . Types: Snuff     Ready to quit: Not Answered Counseling given: Not Answered   Past Medical History:  Diagnosis Date  . Anemia   . Arthritis   . Cataract   . Chronic kidney disease   . Heart murmur   . Hypertension   . 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 Thyroid disease    Past Surgical History:  Procedure Laterality Date  . BACK SURGERY    . EYE SURGERY    . JOINT REPLACEMENT    . REPLACEMENT TOTAL KNEE BILATERAL    . SPINE SURGERY    . YAG LASER APPLICATION Right 2/99/2426   Procedure: YAG LASER APPLICATION;  Surgeon: Rutherford Guys, MD;  Location: [facility];  Service: Ophthalmology;  Laterality: Right;   Family History  Problem Relation Age of Onset  . Heart attack Mother   . Heart disease Mother   . Kidney disease Mother   . Kidney disease Sister     on dialysis   . Hypertension Daughter   . Hypercholesterolemia Daughter   . Anxiety disorder Daughter   . Cancer Sister   . COPD Sister   . Heart disease Sister   . Syncope episode Neg Hx    History  Sexual Activity  . Sexual activity: Not Currently    Outpatient Encounter Prescriptions as of 05/25/2016  Medication Sig  . alendronate (FOSAMAX) 70 MG tablet Take 70 mg by mouth once a week. Take with a full glass of water on an empty stomach.  Marland Kitchen amLODipine (NORVASC) 5 MG tablet  Take 5 mg by mouth daily.  Marland Kitchen aspirin EC 81 MG tablet Take 81 mg by mouth daily.  Marland Kitchen gabapentin (NEURONTIN) 300 MG capsule Take 1 capsule (300 mg total) by mouth 3 (three) times daily.  . hydrochlorothiazide (HYDRODIURIL) 25 MG tablet Take 25 mg by mouth daily.  Marland Kitchen losartan (COZAAR) 100 MG tablet Take 100 mg by mouth daily.  Marland Kitchen SYNTHROID 100 MCG tablet Take 1 tablet by mouth daily.  . [DISCONTINUED] losartan-hydrochlorothiazide (HYZAAR) 100-12.5 MG tablet Take 1 tablet by mouth daily.  . [DISCONTINUED] traMADol-acetaminophen (ULTRACET) 37.5-325 MG tablet Take 1 tablet by mouth every 4 (four) hours as needed.   No facility-administered encounter medications on file as of 05/25/2016.     Activities of Daily Living In your present state of health, do you have any difficulty performing the following activities: 05/25/2016 05/01/2016  Hearing? N N  Vision? N N  Difficulty concentrating or making decisions? Y N  Walking or climbing stairs? Y -  Dressing or bathing? Y Y  Doing errands, shopping? Tempie Donning  Preparing Food and eating ? N -  Using the Toilet? N -  In the past six months, have you  accidently leaked urine? N -  Do you have problems with loss of bowel control? N -  Managing your Medications? N -  Managing your Finances? N -  Housekeeping or managing your Housekeeping? N -  Some recent data might be hidden    Patient Care Team: Raylene Everts, MD as PCP - General (Family Medicine)    Assessment:    Exercise Activities and Dietary recommendations Current Exercise Habits: The patient does not participate in regular exercise at present  Goals    . Blood Pressure < 140/90      Fall Risk Fall Risk  05/25/2016 05/01/2016 01/25/2016  Falls in the past year? Yes Yes Yes  Number falls in past yr: 2 or more 2 or more 2 or more  Injury with Fall? No No No  Risk Factor Category  High Fall Risk - -  Risk for fall due to : History of fall(s);Impaired balance/gait;Impaired mobility - -    Follow up Falls evaluation completed;Falls prevention discussed;Education provided - -   Depression Screen PHQ 2/9 Scores 05/25/2016 05/01/2016 01/25/2016  PHQ - 2 Score 0 0 0     Cognitive Function: Normal by direct observation    There is no immunization history on file for this patient. Screening Tests Health Maintenance  Topic Date Due  . TETANUS/TDAP  05/19/2022 (Originally 03/16/1948)  . PNA vac Low Risk Adult (2 of 2 - PPSV23) 12/14/2016  . INFLUENZA VACCINE  Completed  . DEXA SCAN  Completed      Plan:  I have personally reviewed and addressed the Medicare Annual Wellness questionnaire and have noted the following in the patient's chart:  A. Medical and social history B. Use of alcohol, tobacco or illicit drugs  C. Current medications and supplements D. Functional ability and status E.  Nutritional status F.  Physical activity G. Advance directives H. List of other physicians I.  Hospitalizations, surgeries, and ER visits in previous 12 months J.  Lynn to include cognitive, depression, and falls L. Referrals and appointments - none  In addition, I have reviewed and discussed with patient certain preventive protocols, quality metrics, and best practice recommendations. A written personalized care plan for preventive services as well as general preventive health recommendations were provided to patient.  Signed,   Stormy Fabian, LPN Lead Nurse Health Advisor

## 2016-05-25 NOTE — Patient Instructions (Signed)
Jeanette Brady , Thank you for taking time to come for your Medicare Wellness Visit. I appreciate your ongoing commitment to your health goals. Please review the following plan we discussed and let me know if I can assist you in the future.   Screening recommendations/referrals: Colonoscopy: No longer required Mammogram: No longer required Bone Density: Up to date Recommended yearly ophthalmology/optometry visit for glaucoma screening and checkup Recommended yearly dental visit for hygiene and checkup  Vaccinations: Influenza vaccine: Up to date, will be due 11/2016 Pneumococcal vaccine: Up to date Tdap vaccine: Due now, declined today Shingles vaccine : Due, declined today   Advanced directives: Advance directive discussed with you today. I have provided a copy for you to complete at home and have notarized. Once this is complete please bring a copy in to our office so we can scan it into your chart.  Conditions/risks identified: Pre-obese, recommend starting a routine exercise program at least 3 days a week for 30-45 minutes at a time as tolerated.   Next appointment: Follow up with Dr. Meda Coffee on 10/30/2016 at 1:40 pm for your complete physical. Follow up in 1 year for your annual wellness visit.   Preventive Care 66 Years and Older, Female Preventive care refers to lifestyle choices and visits with your health care provider that can promote health and wellness. What does preventive care include?  A yearly physical exam. This is also called an annual well check.  Dental exams once or twice a year.  Routine eye exams. Ask your health care provider how often you should have your eyes checked.  Personal lifestyle choices, including:  Daily care of your teeth and gums.  Regular physical activity.  Eating a healthy diet.  Avoiding tobacco and drug use.  Limiting alcohol use.  Practicing safe sex.  Taking low-dose aspirin every day.  Taking vitamin and mineral supplements as  recommended by your health care provider. What happens during an annual well check? The services and screenings done by your health care provider during your annual well check will depend on your age, overall health, lifestyle risk factors, and family history of disease. Counseling  Your health care provider may ask you questions about your:  Alcohol use.  Tobacco use.  Drug use.  Emotional well-being.  Home and relationship well-being.  Sexual activity.  Eating habits.  History of falls.  Memory and ability to understand (cognition).  Work and work Statistician.  Reproductive health. Screening  You may have the following tests or measurements:  Height, weight, and BMI.  Blood pressure.  Lipid and cholesterol levels. These may be checked every 5 years, or more frequently if you are over 53 years old.  Skin check.  Lung cancer screening. You may have this screening every year starting at age 21 if you have a 30-pack-year history of smoking and currently smoke or have quit within the past 15 years.  Fecal occult blood test (FOBT) of the stool. You may have this test every year starting at age 73.  Flexible sigmoidoscopy or colonoscopy. You may have a sigmoidoscopy every 5 years or a colonoscopy every 10 years starting at age 45.  Hepatitis C blood test.  Hepatitis B blood test.  Sexually transmitted disease (STD) testing.  Diabetes screening. This is done by checking your blood sugar (glucose) after you have not eaten for a while (fasting). You may have this done every 1-3 years.  Bone density scan. This is done to screen for osteoporosis. You may have this done  starting at age 53.  Mammogram. This may be done every 1-2 years. Talk to your health care provider about how often you should have regular mammograms. Talk with your health care provider about your test results, treatment options, and if necessary, the need for more tests. Vaccines  Your health care  provider may recommend certain vaccines, such as:  Influenza vaccine. This is recommended every year.  Tetanus, diphtheria, and acellular pertussis (Tdap, Td) vaccine. You may need a Td booster every 10 years.  Zoster vaccine. You may need this after age 76.  Pneumococcal 13-valent conjugate (PCV13) vaccine. One dose is recommended after age 93.  Pneumococcal polysaccharide (PPSV23) vaccine. One dose is recommended after age 56. Talk to your health care provider about which screenings and vaccines you need and how often you need them. This information is not intended to replace advice given to you by your health care provider. Make sure you discuss any questions you have with your health care provider. Document Released: 03/19/2015 Document Revised: 11/10/2015 Document Reviewed: 12/22/2014 Elsevier Interactive Patient Education  2017 [place] Prevention in the Home Falls can cause injuries. They can happen to people of all ages. There are many things you can do to make your home safe and to help prevent falls. What can I do on the outside of my home?  Regularly fix the edges of walkways and driveways and fix any cracks.  Remove anything that might make you trip as you walk through a door, such as a raised step or threshold.  Trim any bushes or trees on the path to your home.  Use bright outdoor lighting.  Clear any walking paths of anything that might make someone trip, such as rocks or tools.  Regularly check to see if handrails are loose or broken. Make sure that both sides of any steps have handrails.  Any raised decks and porches should have guardrails on the edges.  Have any leaves, snow, or ice cleared regularly.  Use sand or salt on walking paths during winter.  Clean up any spills in your garage right away. This includes oil or grease spills. What can I do in the bathroom?  Use night lights.  Install grab bars by the toilet and in the tub and shower. Do  not use towel bars as grab bars.  Use non-skid mats or decals in the tub or shower.  If you need to sit down in the shower, use a plastic, non-slip stool.  Keep the floor dry. Clean up any water that spills on the floor as soon as it happens.  Remove soap buildup in the tub or shower regularly.  Attach bath mats securely with double-sided non-slip rug tape.  Do not have throw rugs and other things on the floor that can make you trip. What can I do in the bedroom?  Use night lights.  Make sure that you have a light by your bed that is easy to reach.  Do not use any sheets or blankets that are too big for your bed. They should not hang down onto the floor.  Have a firm chair that has side arms. You can use this for support while you get dressed.  Do not have throw rugs and other things on the floor that can make you trip. What can I do in the kitchen?  Clean up any spills right away.  Avoid walking on wet floors.  Keep items that you use a lot in easy-to-reach places.  If you need to reach something above you, use a strong step stool that has a grab bar.  Keep electrical cords out of the way.  Do not use floor polish or wax that makes floors slippery. If you must use wax, use non-skid floor wax.  Do not have throw rugs and other things on the floor that can make you trip. What can I do with my stairs?  Do not leave any items on the stairs.  Make sure that there are handrails on both sides of the stairs and use them. Fix handrails that are broken or loose. Make sure that handrails are as long as the stairways.  Check any carpeting to make sure that it is firmly attached to the stairs. Fix any carpet that is loose or worn.  Avoid having throw rugs at the top or bottom of the stairs. If you do have throw rugs, attach them to the floor with carpet tape.  Make sure that you have a light switch at the top of the stairs and the bottom of the stairs. If you do not have them,  ask someone to add them for you. What else can I do to help prevent falls?  Wear shoes that:  Do not have high heels.  Have rubber bottoms.  Are comfortable and fit you well.  Are closed at the toe. Do not wear sandals.  If you use a stepladder:  Make sure that it is fully opened. Do not climb a closed stepladder.  Make sure that both sides of the stepladder are locked into place.  Ask someone to hold it for you, if possible.  Clearly mark and make sure that you can see:  Any grab bars or handrails.  First and last steps.  Where the edge of each step is.  Use tools that help you move around (mobility aids) if they are needed. These include:  Canes.  Walkers.  Scooters.  Crutches.  Turn on the lights when you go into a dark area. Replace any light bulbs as soon as they burn out.  Set up your furniture so you have a clear path. Avoid moving your furniture around.  If any of your floors are uneven, fix them.  If there are any pets around you, be aware of where they are.  Review your medicines with your doctor. Some medicines can make you feel dizzy. This can increase your chance of falling. Ask your doctor what other things that you can do to help prevent falls. This information is not intended to replace advice given to you by your health care provider. Make sure you discuss any questions you have with your health care provider. Document Released: 12/17/2008 Document Revised: 07/29/2015 Document Reviewed: 03/27/2014 Elsevier Interactive Patient Education  2017 [employer].

## 2016-06-06 ENCOUNTER — Other Ambulatory Visit: Payer: Self-pay | Admitting: Family Medicine

## 2016-07-03 ENCOUNTER — Emergency Department (HOSPITAL_COMMUNITY): Payer: PPO

## 2016-07-03 ENCOUNTER — Encounter (HOSPITAL_COMMUNITY): Payer: Self-pay

## 2016-07-03 ENCOUNTER — Emergency Department (HOSPITAL_COMMUNITY)
Admission: EM | Admit: 2016-07-03 | Discharge: 2016-07-03 | Disposition: A | Discharge status: Home / Self Care | Payer: PPO | Attending: Emergency Medicine | Admitting: Emergency Medicine

## 2016-07-03 DIAGNOSIS — Z79899 Other long term (current) drug therapy: Secondary | ICD-10-CM | POA: Insufficient documentation

## 2016-07-03 DIAGNOSIS — I129 Hypertensive chronic kidney disease with stage 1 through stage 4 chronic kidney disease, or unspecified chronic kidney disease: Secondary | ICD-10-CM | POA: Diagnosis not present

## 2016-07-03 DIAGNOSIS — M722 Plantar fascial fibromatosis: Secondary | ICD-10-CM | POA: Insufficient documentation

## 2016-07-03 DIAGNOSIS — M79672 Pain in left foot: Secondary | ICD-10-CM | POA: Diagnosis not present

## 2016-07-03 DIAGNOSIS — F17290 Nicotine dependence, other tobacco product, uncomplicated: Secondary | ICD-10-CM | POA: Diagnosis not present

## 2016-07-03 DIAGNOSIS — I7090 Unspecified atherosclerosis: Secondary | ICD-10-CM | POA: Diagnosis not present

## 2016-07-03 DIAGNOSIS — N189 Chronic kidney disease, unspecified: Secondary | ICD-10-CM | POA: Diagnosis not present

## 2016-07-03 DIAGNOSIS — Z7982 Long term (current) use of aspirin: Secondary | ICD-10-CM | POA: Insufficient documentation

## 2016-07-03 DIAGNOSIS — I110 Hypertensive heart disease with heart failure: Secondary | ICD-10-CM | POA: Diagnosis not present

## 2016-07-03 DIAGNOSIS — E039 Hypothyroidism, unspecified: Secondary | ICD-10-CM | POA: Insufficient documentation

## 2016-07-03 DIAGNOSIS — I25119 Atherosclerotic heart disease of native coronary artery with unspecified angina pectoris: Secondary | ICD-10-CM | POA: Diagnosis not present

## 2016-07-03 MED ORDER — IBUPROFEN 400 MG PO TABS: 400.0000 mg | ORAL_TABLET | Freq: Three times a day (TID) | ORAL | 0 refills | Status: AC

## 2016-07-03 NOTE — ED Triage Notes (Signed)
Pt reports pain in left heel for the past few days but worse today.  Denies injury.

## 2016-07-03 NOTE — ED Provider Notes (Signed)
[facility] DEPT Provider Note   CSN: [id number] Arrival date & time: 07/03/16  0720     History   Chief Complaint Chief Complaint  Patient presents with  . Foot Pain    HPI Jeanette Brady is a 83 y.o. female.  HPI Patient presents with intermittent left heel pain that worsened last night. Denies any recent trauma. No swelling or redness associated with it. Pain is worsened with ambulation. Past Medical History:  Diagnosis Date  . Anemia   . Arthritis   . Cataract   . Chronic kidney disease   . Heart murmur   . Hypertension   . 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 Thyroid disease     Patient Active Problem List   Diagnosis Date Noted  . Intention tremor 05/01/2016  . 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 Primary osteoarthritis involving multiple joints 01/25/2016  . Personal history of benign brain tumor 01/25/2016  . Syncope 10/15/2015  . Hypertension 10/15/2015  . Hypothyroidism 10/15/2015  . Heart murmur 10/15/2015  . Kidney disease 10/15/2015  . Normocytic anemia 10/15/2015  . SHOULDER PAIN 06/09/2008    Past Surgical History:  Procedure Laterality Date  . BACK SURGERY    . EYE SURGERY    . JOINT REPLACEMENT    . REPLACEMENT TOTAL KNEE BILATERAL    . SPINE SURGERY    . YAG LASER APPLICATION Right 6/46/8032   Procedure: YAG LASER APPLICATION;  Surgeon: Rutherford Guys, MD;  Location: [facility];  Service: Ophthalmology;  Laterality: Right;    OB History    Gravida Para Term Preterm AB Living   2 2 2     2    SAB TAB Ectopic Multiple Live Births                   Home Medications    Prior to Admission medications   Medication Sig Start Date End Date Taking? Authorizing Provider  alendronate (FOSAMAX) 70 MG tablet Take 70 mg by mouth once a week. Take with a full glass of water on an empty stomach.   Yes Historical Provider, MD  aspirin EC 81 MG tablet Take 81 mg by mouth daily.   Yes Historical Provider, MD  Cholecalciferol (VITAMIN D) 2000 units  CAPS Take 1 capsule by mouth daily.   Yes Historical Provider, MD  gabapentin (NEURONTIN) 300 MG capsule Take 1 capsule (300 mg total) by mouth 3 (three) times daily. Patient taking differently: Take 600 mg by mouth 3 (three) times daily.  05/01/16  Yes Raylene Everts, MD  hydrochlorothiazide (HYDRODIURIL) 25 MG tablet Take 25 mg by mouth daily.   Yes Historical Provider, MD  losartan (COZAAR) 100 MG tablet Take 100 mg by mouth daily.   Yes Historical Provider, MD  SYNTHROID 100 MCG tablet Take 1 tablet by mouth daily. 11/29/13  Yes Historical Provider, MD  traMADol-acetaminophen (ULTRACET) 37.5-325 MG tablet TAKE 1 TABLET BY MOUTH EVERY 4 HOURS AS NEEDED 06/13/16  Yes Raylene Everts, MD  ibuprofen (ADVIL,MOTRIN) 400 MG tablet Take 1 tablet (400 mg total) by mouth 3 (three) times daily after meals. 07/03/16 07/10/16  Julianne Rice, MD    Family History Family History  Problem Relation Age of Onset  . Heart attack Mother   . Heart disease Mother   . Kidney disease Mother   . Kidney disease Sister     on dialysis   . Hypertension Daughter   . Hypercholesterolemia Daughter   . Anxiety disorder Daughter   .  Cancer Sister   . COPD Sister   . Heart disease Sister   . Syncope episode Neg Hx     Social History Social History  Substance Use Topics  . Smoking status: Former Smoker    Packs/day: 0.05    Years: 20.00    Types: Cigarettes    Quit date: 03/06/1993  . Smokeless tobacco: Current User    Types: Snuff  . Alcohol use No     Allergies   Patient has no known allergies.   Review of Systems Review of Systems  Musculoskeletal: Positive for arthralgias.  Skin: Negative for color change and rash.  Neurological: Negative for weakness and numbness.  All other systems reviewed and are negative.    Physical Exam Updated Vital Signs BP (!) 199/69   Pulse (!) 57   Temp 97.9 F (36.6 C) (Oral)   Resp 20   Ht 5' (1.524 m)   Wt 135 lb (61.2 kg)   SpO2 100%   BMI 26.37  kg/m   Physical Exam  Constitutional: She is oriented to person, place, and time. She appears well-developed and well-nourished.  HENT:  Head: Normocephalic and atraumatic.  Eyes: EOM are normal. Pupils are equal, round, and reactive to light.  Neck: Normal range of motion. Neck supple.  Cardiovascular: Normal rate.   Pulmonary/Chest: Effort normal.  Abdominal: Soft.  Musculoskeletal: Normal range of motion. She exhibits tenderness.  Patient has tenderness to palpation of the plantar surface of the left foot just anterior to the calcaneus. There is no obvious erythema, warmth or swelling. Unable to palpate dorsalis pedis and posterior tibial pulses in the left foot.. Delayed cap refill. No calf swelling or tenderness. Dopplerable posterior tibial and dorsalis pedis pulses in the left foot though weak signal.  Neurological: She is alert and oriented to person, place, and time.  Ambulating without difficulty. Sensation intact. Plantar flexion and dorsiflexion bilaterally intact.  Skin: Skin is warm and dry. Capillary refill takes less than 2 seconds. No rash noted. No erythema.  Psychiatric: She has a normal mood and affect. Her behavior is normal.  Nursing note and vitals reviewed.    ED Treatments / Results  Labs (all labs ordered are listed, but only abnormal results are displayed) Labs Reviewed - No data to display  EKG  EKG Interpretation None       Radiology Dg Foot Complete Left  Result Date: 07/03/2016 CLINICAL DATA:  Left heel pain.  No injury. EXAM: LEFT FOOT - COMPLETE 3+ VIEW COMPARISON:  No recent prior . FINDINGS: No acute bony or joint abnormality identified. Diffuse degenerative change. No evidence of fracture dislocation. Peripheral vascular calcification. IMPRESSION: 1. Diffuse osteopenia and degenerative change. No acute abnormality. 2. Peripheral vascular disease. Electronically Signed   By: Marcello Moores  Register   On: 07/03/2016 08:15    Procedures Procedures  (including critical care time)  Medications Ordered in ED Medications - No data to display   Initial Impression / Assessment and Plan / ED Course  I have reviewed the triage vital signs and the nursing notes.  Pertinent labs & imaging results that were available during my care of the patient were reviewed by me and considered in my medical decision making (see chart for details).    Strict changes on x-ray and appears to be have some spurring at the calcaneus. Patient also with notable atherosclerosis. The patient may have some degree of claudication causing her symptoms. We'll place in hard bottom shoe and treated with low dose,  short course anti-inflammatory. Her last creatinine was 1.01. No known cardiac or gastrointestinal disease. She is advised to take the medication with food and to follow-up closely with her from provider. She is advised to follow-up with a podiatrist as well as vascular surgery. Return precautions given.   Final Clinical Impressions(s) / ED Diagnoses   Final diagnoses:  Foot pain, left  Plantar fasciitis of left foot  Atherosclerosis    New Prescriptions New Prescriptions   IBUPROFEN (ADVIL,MOTRIN) 400 MG TABLET    Take 1 tablet (400 mg total) by mouth 3 (three) times daily after meals.     Julianne Rice, MD 07/03/16 [id number]

## 2016-10-30 ENCOUNTER — Ambulatory Visit (INDEPENDENT_AMBULATORY_CARE_PROVIDER_SITE_OTHER): Payer: PPO | Admitting: Family Medicine

## 2016-10-30 ENCOUNTER — Encounter: Payer: Self-pay | Admitting: Family Medicine

## 2016-10-30 VITALS — BP 140/62 | HR 76 | Temp 98.5°F | Resp 18 | Ht 60.0 in | Wt 132.1 lb

## 2016-10-30 DIAGNOSIS — M159 Polyosteoarthritis, unspecified: Secondary | ICD-10-CM

## 2016-10-30 DIAGNOSIS — I10 Essential (primary) hypertension: Secondary | ICD-10-CM

## 2016-10-30 DIAGNOSIS — M150 Primary generalized (osteo)arthritis: Secondary | ICD-10-CM

## 2016-10-30 DIAGNOSIS — R634 Abnormal weight loss: Secondary | ICD-10-CM | POA: Diagnosis not present

## 2016-10-30 DIAGNOSIS — Z23 Encounter for immunization: Secondary | ICD-10-CM

## 2016-10-30 DIAGNOSIS — R600 Localized edema: Secondary | ICD-10-CM | POA: Diagnosis not present

## 2016-10-30 DIAGNOSIS — G252 Other specified forms of tremor: Secondary | ICD-10-CM

## 2016-10-30 MED ORDER — FUROSEMIDE 20 MG PO TABS: 20.0000 mg | ORAL_TABLET | Freq: Every day | ORAL | 3 refills | Status: AC

## 2016-10-30 MED ORDER — IBUPROFEN 400 MG PO TABS: 400.0000 mg | ORAL_TABLET | Freq: Four times a day (QID) | ORAL | 11 refills | Status: DC | PRN

## 2016-10-30 NOTE — Patient Instructions (Signed)
Stop the hydrochlorothiazide Take the furosemide once a day in the morning Be sure to eat potassium foods Elevate feet and consider compression knee socks  Take the ibuprofen as needed for pain Take with food Stop if you develop heartburn or stomach pain  See me in 3 months Need blood work next time

## 2016-10-30 NOTE — Progress Notes (Signed)
Chief Complaint  Patient presents with  . Follow-up    5 month  Patient is here with her daughter She complains of arthritis pain in her low back, radiating into her hip and leg. She gets the best relief taking 2 Aleve. She does this as needed. Not every day. She knows to take it with food. She's had no GI upset. She requests a prescription. She is given Motrin( ibuprofen )400 mg She has swelling in her ankles. She takes hydrochlorothiazide 25 a day. She is not on a well-balanced diet. She eats a lot of junk food. She probably eats too much salt. In spite of this she states she has no appetite. She is losing weight. She does drink ensure once or twice a day. In 2016 in early 2017 she weighed 156 pounds. Today she weighs 132 pounds. I discussed this with patient and her daughter. They will try to work on her nutrition. Her blood pressure is well controlled.   Patient Active Problem List   Diagnosis Date Noted  . Intention tremor 05/01/2016  . 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 Primary osteoarthritis involving multiple joints 01/25/2016  . Personal history of benign brain tumor 01/25/2016  . Syncope 10/15/2015  . Hypertension 10/15/2015  . Hypothyroidism 10/15/2015  . Heart murmur 10/15/2015  . Kidney disease 10/15/2015  . Normocytic anemia 10/15/2015  . SHOULDER PAIN 06/09/2008    Outpatient Encounter Prescriptions as of 10/30/2016  Medication Sig  . alendronate (FOSAMAX) 70 MG tablet Take 70 mg by mouth once a week. Take with a full glass of water on an empty stomach.  Marland Kitchen aspirin EC 81 MG tablet Take 81 mg by mouth daily.  . Cholecalciferol (VITAMIN D) 2000 units CAPS Take 1 capsule by mouth daily.  Marland Kitchen gabapentin (NEURONTIN) 300 MG capsule Take 1 capsule (300 mg total) by mouth 3 (three) times daily.  Marland Kitchen losartan (COZAAR) 100 MG tablet Take 100 mg by mouth daily.  Marland Kitchen SYNTHROID 100 MCG tablet Take 1 tablet by mouth daily.  . furosemide (LASIX) 20 MG tablet Take 1 tablet (20 mg  total) by mouth daily.  Marland Kitchen ibuprofen (ADVIL,MOTRIN) 400 MG tablet Take 1 tablet (400 mg total) by mouth every 6 (six) hours as needed. Arthritis pain   No facility-administered encounter medications on file as of 10/30/2016.     No Known Allergies  Review of Systems  Constitutional: Positive for unexpected weight change. Negative for activity change and appetite change.  HENT: Negative for congestion, dental problem, postnasal drip, rhinorrhea and trouble swallowing.        States no problems with chewing or swallowing  Eyes: Negative for redness and visual disturbance.  Respiratory: Negative for cough and shortness of breath.   Cardiovascular: Positive for leg swelling. Negative for chest pain and palpitations.       Legs more swollen the last couple of weeks  Gastrointestinal: Negative for abdominal pain, constipation and diarrhea.  Genitourinary: Negative for difficulty urinating and frequency.  Musculoskeletal: Positive for back pain and gait problem. Negative for arthralgias.  Neurological: Negative for dizziness and headaches.  Psychiatric/Behavioral: Negative for dysphoric mood. The patient is not nervous/anxious.     BP 140/62   Pulse 76   Temp 98.5 F (36.9 C) (Temporal)   Resp 18   Ht 5' (1.524 m)   Wt 132 lb 1.9 oz (59.9 kg)   SpO2 97%   BMI 25.80 kg/m   Physical Exam  Constitutional: She is oriented to person, place,  and time. She appears well-developed and well-nourished.  Elderly woman. Uses walker. Smiling, cooperative  HENT:  Head: Normocephalic and atraumatic.  Right Ear: External ear normal.  Left Ear: External ear normal.  Mouth/Throat: Oropharynx is clear and moist.  Eyes: Pupils are equal, round, and reactive to light. Conjunctivae are normal.  Neck: Normal range of motion. Neck supple. No thyromegaly present.  Cardiovascular: Normal rate and regular rhythm.   Murmur heard. Pulmonary/Chest: Effort normal and breath sounds normal. No respiratory  distress.  No rales  Abdominal: Soft. Bowel sounds are normal. There is no tenderness.  Musculoskeletal: Normal range of motion. She exhibits edema.  2+ pitting edema to mid shin. Calves are soft  Lymphadenopathy:    She has no cervical adenopathy.  Neurological: She is alert and oriented to person, place, and time.  Gait normal  Skin: Skin is warm and dry.  Psychiatric: She has a normal mood and affect. Her behavior is normal. Thought content normal.  Nursing note and vitals reviewed.   ASSESSMENT/PLAN:  1. Pedal edema No evidence of heart failure. Probably salt overload and dietary indiscretion with inactivity. We'll add Lasix. Discussed potassium foods.  2. Unintended weight loss Discussed with patient and family. She'll make a better effort at nutrition.  3. Intention tremor Stable  4. Essential hypertension Blood pressure controlled. - Basic Metabolic Panel (BMET)  5. Primary osteoarthritis involving multiple joints Ibuprofen  6. Need for influenza vaccination - Flu Vaccine QUAD 36+ mos IM  7. Need for pneumococcal vaccination - Pneumococcal conjugate vaccine 13-valent IM   Patient Instructions  Stop the hydrochlorothiazide Take the furosemide once a day in the morning Be sure to eat potassium foods Elevate feet and consider compression knee socks  Take the ibuprofen as needed for pain Take with food Stop if you develop heartburn or stomach pain  See me in 3 months Need blood work next time      Raylene Everts, MD

## 2017-01-22 ENCOUNTER — Other Ambulatory Visit: Payer: Self-pay | Admitting: Family Medicine

## 2017-01-29 NOTE — Telephone Encounter (Signed)
Seen 8 [phone] 18 

## 2017-01-29 NOTE — Telephone Encounter (Signed)
Does she still take the norvasc?

## 2017-02-05 ENCOUNTER — Telehealth: Payer: Self-pay

## 2017-02-05 MED ORDER — GABAPENTIN 300 MG PO CAPS: 300.0000 mg | ORAL_CAPSULE | Freq: Three times a day (TID) | ORAL | 1 refills | Status: DC

## 2017-02-05 NOTE — Telephone Encounter (Signed)
Seen 8 [phone] 18   Is due to be seen, needs labs a few days prior

## 2017-02-06 ENCOUNTER — Telehealth: Payer: Self-pay | Admitting: Family Medicine

## 2017-02-06 NOTE — Telephone Encounter (Signed)
See dr Jeanette Brady last note from august.

## 2017-02-06 NOTE — Telephone Encounter (Signed)
Called patient to schedule a followup, she is due for a 3 month f/u. No answer, no voicemail.

## 2017-02-06 NOTE — Telephone Encounter (Signed)
Patient is already scheduled for 28m followup in feb. Cant see that she has missed any appts.

## 2017-03-08 ENCOUNTER — Other Ambulatory Visit: Payer: Self-pay

## 2017-03-08 NOTE — Patient Outreach (Signed)
[facility]) Care Management  03/08/2017  Jeanette Brady August 10, 1929 [id number]  TELEPHONE SCREENING Referral date: 03/08/16 Referral source: [employer] Referral reason: heart failure, weight loss/ gain, chewing tobacco Insurance: [employer]  Telephone call to patient regarding [employer] referral. HIPAA verified with patient.  Patient gave different date of birth [birth year].  Patient states she has paperwork that has date of birth [birth year] and [birth year] down as her birthday. Patient states she goes by [birth year].  Patient verified address.  Discussed and offered [facility] care management services to patient. Patient refused services. Patient states she does not need a nurse at this time. Patient reports she lives with her husband and has help from her daughter.   Patient states she has problems with her legs being shaky. Patient states she uses a cane.  Patient states she had her legs checked in the past and was told her bones were weak. Patient denies having any falls.  Patient reports she sees her primary provider every 3 months.  RNCM offered to send patient [facility] care management brochure and 24 nurse advise line magnet with contact phone number. Patient states she is able to read a little. Patient states her daughter helps her to read information she receives.  Patient agreed to receive [facility] care management brochure/ magnet.   PLAN: RNCM will refer patient to care management assistant to close patient due to refusal of services.  RNCM will send patient [facility] care management brochure/ magnet RNCM will send notification to patients primary MD of refusal of [facility] care management services.   Quinn Plowman RN,BSN,CCM [facility] Telephonic  [phone]

## 2017-03-23 ENCOUNTER — Other Ambulatory Visit: Payer: Self-pay | Admitting: Family Medicine

## 2017-03-26 ENCOUNTER — Other Ambulatory Visit: Payer: Self-pay | Admitting: Family Medicine

## 2017-05-03 ENCOUNTER — Ambulatory Visit: Payer: PPO | Admitting: Family Medicine

## 2017-05-08 ENCOUNTER — Ambulatory Visit: Payer: Self-pay | Admitting: Family Medicine

## 2017-05-11 ENCOUNTER — Encounter: Payer: Self-pay | Admitting: Family Medicine

## 2017-05-11 ENCOUNTER — Ambulatory Visit (INDEPENDENT_AMBULATORY_CARE_PROVIDER_SITE_OTHER): Payer: Self-pay | Admitting: Family Medicine

## 2017-05-11 ENCOUNTER — Other Ambulatory Visit: Payer: Self-pay

## 2017-05-11 VITALS — BP 166/80 | HR 74 | Temp 98.4°F | Resp 16 | Ht 60.0 in | Wt 135.5 lb

## 2017-05-11 DIAGNOSIS — M25511 Pain in right shoulder: Secondary | ICD-10-CM

## 2017-05-11 DIAGNOSIS — I10 Essential (primary) hypertension: Secondary | ICD-10-CM

## 2017-05-11 DIAGNOSIS — M7062 Trochanteric bursitis, left hip: Secondary | ICD-10-CM

## 2017-05-11 DIAGNOSIS — E038 Other specified hypothyroidism: Secondary | ICD-10-CM | POA: Diagnosis not present

## 2017-05-11 DIAGNOSIS — E559 Vitamin D deficiency, unspecified: Secondary | ICD-10-CM

## 2017-05-11 MED ORDER — VITAMIN D3 1.25 MG (50000 UT) PO CAPS: 1.0000 | ORAL_CAPSULE | ORAL | 3 refills | Status: AC

## 2017-05-11 MED ORDER — METHYLPREDNISOLONE ACETATE 80 MG/ML IJ SUSP: 80.0000 mg | Freq: Once | INTRAMUSCULAR | Status: DC

## 2017-05-11 MED ORDER — IBUPROFEN 600 MG PO TABS: 600.0000 mg | ORAL_TABLET | Freq: Three times a day (TID) | ORAL | 3 refills | Status: AC | PRN

## 2017-05-11 NOTE — Progress Notes (Signed)
Chief Complaint  Patient presents with  . Follow-up   Patient is here for routine follow-up. She is an independent 84 year old who lives alone.  She is accompanied by her daughter. Her only complaints today are orthopedic ones.  She states her right shoulder has become more painful.  It keeps her up at night.  She also complains of pain in her left hip.  She cannot lay on her left side.  She has had no accident or injury.  His pains are both not traumatic.  The shoulder pain has been progressively worsening for many months.  The hip pain has been present for only about 1 month.  She was told many years ago that she "tore something" in her right shoulder and thinks it was her rotator cuff.  She takes ibuprofen 400 mg when it hurts.  She wonders if this can be stronger.  I told her that I will give her 600 mg ibuprofen as long she takes it with food.  She takes this infrequently.  She knows to watch for stomach upset. Her blood pressure is not well controlled.  We discussed her medication.  She does not take her Lasix on days that she has medical visits.  She is reminded to take her Lasix every day.  The daughter agrees to take her blood pressure at home and let me know if it stays high. Patient comes in infrequently.  It has been over a year since her last lab work.  She agrees to have lab work today.  I explained her that on Lasix, blood pressure medicine, and thyroid replacement lab work at least once a years a good idea. I reminded her to come back in the fall for flu shot and her second pneumonia vaccine   Patient Active Problem List   Diagnosis Date Noted  . Intention tremor 05/01/2016  . 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 Primary osteoarthritis involving multiple joints 01/25/2016  . Personal history of benign brain tumor 01/25/2016  . Syncope 10/15/2015  . Hypertension 10/15/2015  . Hypothyroidism 10/15/2015  . Heart murmur 10/15/2015  . Kidney disease 10/15/2015  . Normocytic  anemia 10/15/2015  . SHOULDER PAIN 06/09/2008    Outpatient Encounter Medications as of 05/11/2017  Medication Sig  . amLODipine (NORVASC) 5 MG tablet TAKE 1 TABLET EVERY DAY  . Cholecalciferol (VITAMIN D3) 50000 units CAPS Take 1 capsule by mouth every 30 (thirty) days.  . furosemide (LASIX) 20 MG tablet Take 1 tablet (20 mg total) by mouth daily.  Marland Kitchen gabapentin (NEURONTIN) 300 MG capsule Take 1 capsule (300 mg total) by mouth 3 (three) times daily.  Marland Kitchen ibuprofen (ADVIL,MOTRIN) 600 MG tablet Take 1 tablet (600 mg total) by mouth every 8 (eight) hours as needed. Arthritis pain  . levothyroxine (SYNTHROID, LEVOTHROID) 100 MCG tablet TAKE 1 TABLET BY MOUTH EVERY DAY  . losartan (COZAAR) 100 MG tablet TAKE 1 TABLET BY MOUTH EVERY DAY  . alendronate (FOSAMAX) 70 MG tablet Take 70 mg by mouth once a week. Take with a full glass of water on an empty stomach.  Marland Kitchen aspirin EC 81 MG tablet Take 81 mg by mouth daily.  . traMADol-acetaminophen (ULTRACET) 37.5-325 MG tablet TAKE 1 TABLET BY MOUTH EVERY 4 HOURS AS NEEDED   Facility-Administered Encounter Medications as of 05/11/2017  Medication  . methylPREDNISolone acetate (DEPO-MEDROL) injection 80 mg    Not on File  Review of Systems  Constitutional: Negative for activity change, appetite change and unexpected weight change.  Weight is stable at this visit  HENT: Negative for congestion and dental problem.   Eyes: Negative for redness and visual disturbance.  Respiratory: Negative for cough and shortness of breath.   Cardiovascular: Positive for leg swelling. Negative for chest pain and palpitations.       Occasional swelling in legs  Gastrointestinal: Negative for constipation and diarrhea.  Genitourinary: Positive for frequency. Negative for difficulty urinating.       Wears depends at times, urinary frequency  Musculoskeletal: Positive for arthralgias. Negative for back pain.  Neurological: Positive for tremors.  Psychiatric/Behavioral:  Negative for confusion, dysphoric mood and sleep disturbance.    BP (!) 166/80   Pulse 74   Temp 98.4 F (36.9 C) (Temporal)   Resp 16   Ht 5' (1.524 m)   Wt 135 lb 8 oz (61.5 kg)   SpO2 98%   BMI 26.46 kg/m   Physical Exam  Constitutional: She is oriented to person, place, and time. She appears well-developed and well-nourished.  Elderly woman. Uses walker. Smiling, cooperative  HENT:  Head: Normocephalic and atraumatic.  Right Ear: External ear normal.  Left Ear: External ear normal.  Mouth/Throat: Oropharynx is clear and moist.  Eyes: Conjunctivae are normal. Pupils are equal, round, and reactive to light.  Neck: Normal range of motion. Neck supple. No thyromegaly present.  Cardiovascular: Normal rate and regular rhythm.  Murmur heard. Pulmonary/Chest: Effort normal and breath sounds normal. No respiratory distress.  No rales  Abdominal: Soft. Bowel sounds are normal. There is no tenderness.  Musculoskeletal: Normal range of motion. She exhibits edema.  1+ pitting edema to mid shin. Calves are soft. Right shoulder has limited range of motion, can abduct only to 60 degrees, pain with internal and external rotation.  Left hip has full range of motion.  Back is nontender.  Reflexes strength sensation are good.  Tenderness is localized directly over the left greater trochanter.  Lymphadenopathy:    She has no cervical adenopathy.  Neurological: She is alert and oriented to person, place, and time.  Gait normal  Skin: Skin is warm and dry.  Psychiatric: She has a normal mood and affect. Her behavior is normal. Thought content normal.  Nursing note and vitals reviewed.  Discussed treatment of shoulder pain and hip pain.  Discussed anti-inflammatory medications, ice, and physical therapy as appropriate conservative care.  Patient smiles and states she would "rather have a shot".  I told her that I cannot promise her chronic shoulder pain will improved dramatically, although it  certainly could help.  The trochanteric bursitis should respond well to an injection . Time out Consent Posterior (   RIGHT     ) shoulder prepped and marked 1cc of 1% lidocaine wheal placed at injection site 3 cc of 1% lidocaine with 40 mg of DepoMedrol placed into subacromial space Patient tolerated procedure well Post injection instructions reviewed. Time out Consent Area of maximal tenderness palpated and marked. Location: Left greater trochanter Cleansed with alcohol. Injected with Icc 1% lidocaine with 40 mg of DepoMedrol. Patient tolerated procedure well. Post injection care discussed.  ASSESSMENT/PLAN:  1. Essential hypertension - CBC - COMPLETE METABOLIC PANEL WITH GFR - TSH - Urinalysis, Routine w reflex microscopic - VITAMIN D 25 Hydroxy (Vit-D Deficiency, Fractures)  2. Other specified hypothyroidism  3. Pain in joint of right shoulder - PR DRAIN/INJECT LARGE JOINT/BURSA - methylPREDNISolone acetate (DEPO-MEDROL) injection 80 mg  4. Trochanteric bursitis of left hip - PR DRAIN/INJECT LARGE JOINT/BURSA - methylPREDNISolone  acetate (DEPO-MEDROL) injection 80 mg  5. Vitamin D deficiency    Patient Instructions  Need lab test today No change in medications I did increase ibuprofen to 800 mg as needed for pain You should take the vitamin D once a month Put ice on the shoulder and hip injections as needed for pain x 24 hours   Activity as tolerated Try to reduce salt in the diet.  This helps the blood pressure Need follow-up in 6 months.  We will call you about an appointment.    Raylene Everts, MD

## 2017-05-11 NOTE — Patient Instructions (Addendum)
Need lab test today No change in medications I did increase ibuprofen to 800 mg as needed for pain You should take the vitamin D once a month Put ice on the shoulder and hip injections as needed for pain x 24 hours   Activity as tolerated Try to reduce salt in the diet.  This helps the blood pressure Need follow-up in 6 months.  We will call you about an appointment.

## 2017-05-11 NOTE — Progress Notes (Signed)
depo 

## 2017-05-12 LAB — COMPLETE METABOLIC PANEL WITH GFR
AG Ratio: 1.8 (calc) (ref 1.0–2.5)
ALT: 6 U/L (ref 6–29)
AST: 14 U/L (ref 10–35)
Albumin: 4.2 g/dL (ref 3.6–5.1)
Alkaline phosphatase (APISO): 30 U/L — ABNORMAL LOW (ref 33–130)
BUN/Creatinine Ratio: 13 (calc) (ref 6–22)
BUN: 13 mg/dL (ref 7–25)
CALCIUM: 10.2 mg/dL (ref 8.6–10.4)
CO2: 28 mmol/L (ref 20–32)
Chloride: 106 mmol/L (ref 98–110)
Creat: 1.04 mg/dL — ABNORMAL HIGH (ref 0.60–0.88)
GFR, EST NON AFRICAN AMERICAN: 49 mL/min/{1.73_m2} — AB (ref >=60)
GFR, Est African American: 56 mL/min/{1.73_m2} — ABNORMAL LOW (ref >=60)
Globulin: 2.3 g/dL (calc) (ref 1.9–3.7)
Glucose, Bld: 105 mg/dL (ref 65–139)
Potassium: 4.2 mmol/L (ref 3.5–5.3)
Sodium: 141 mmol/L (ref 135–146)
Total Bilirubin: 0.5 mg/dL (ref 0.2–1.2)
Total Protein: 6.5 g/dL (ref 6.1–8.1)

## 2017-05-12 LAB — CBC
HCT: 34.6 % — ABNORMAL LOW (ref 35.0–45.0)
Hemoglobin: 11.5 g/dL — ABNORMAL LOW (ref 11.7–15.5)
MCH: 29.8 pg (ref 27.0–33.0)
MCHC: 33.2 g/dL (ref 32.0–36.0)
MCV: 89.6 fL (ref 80.0–100.0)
MPV: 11.9 fL (ref 7.5–12.5)
PLATELETS: 235 10*3/uL (ref 140–400)
RBC: 3.86 10*6/uL (ref 3.80–5.10)
RDW: 12 % (ref 11.0–15.0)
WBC: 5.6 10*3/uL (ref 3.8–10.8)

## 2017-05-12 LAB — URINALYSIS, ROUTINE W REFLEX MICROSCOPIC
BILIRUBIN URINE: NEGATIVE
Bacteria, UA: NONE SEEN /HPF
GLUCOSE, UA: NEGATIVE
Hgb urine dipstick: NEGATIVE
Hyaline Cast: NONE SEEN /LPF
Ketones, ur: NEGATIVE
NITRITE: NEGATIVE
PROTEIN: NEGATIVE
RBC / HPF: NONE SEEN /HPF (ref 0–2)
Specific Gravity, Urine: 1.007 (ref 1.001–1.03)
Squamous Epithelial / LPF: NONE SEEN /HPF (ref <=5)
pH: 6.5 (ref 5.0–8.0)

## 2017-05-12 LAB — TSH: TSH: 0.4 mIU/L (ref 0.40–4.50)

## 2017-05-12 LAB — VITAMIN D 25 HYDROXY (VIT D DEFICIENCY, FRACTURES): Vit D, 25-Hydroxy: 123 ng/mL — ABNORMAL HIGH (ref 30–100)

## 2017-05-14 ENCOUNTER — Encounter: Payer: Self-pay | Admitting: Family Medicine

## 2017-05-15 ENCOUNTER — Telehealth: Payer: Self-pay

## 2017-05-15 NOTE — Telephone Encounter (Signed)
Called patient daughter, LVM advising her to call back for lab results.

## 2017-05-15 NOTE — Telephone Encounter (Signed)
-----   Message from Raylene Everts, MD sent at 05/14/2017  5:06 PM EDT ----- A letter will follow with her test results.  Please call in advance of the lateral at the know she needs to stop her vitamin D for a while.  This was discussed with the daughter.  She is taking too much.  She can resume it after a month, taking once a month

## 2017-05-16 ENCOUNTER — Telehealth: Payer: Self-pay

## 2017-05-16 NOTE — Telephone Encounter (Signed)
Called and notified patient daughter of lab results. Patient daughter had no questions or concerns.

## 2017-05-16 NOTE — Telephone Encounter (Signed)
-----   Message from Raylene Everts, MD sent at 05/14/2017  5:06 PM EDT ----- A letter will follow with her test results.  Please call in advance of the lateral at the know she needs to stop her vitamin D for a while.  This was discussed with the daughter.  She is taking too much.  She can resume it after a month, taking once a month

## 2017-05-29 ENCOUNTER — Ambulatory Visit: Payer: PPO

## 2017-06-15 ENCOUNTER — Other Ambulatory Visit: Payer: Self-pay | Admitting: Family Medicine

## 2017-06-21 ENCOUNTER — Other Ambulatory Visit: Payer: Self-pay | Admitting: Family Medicine

## 2017-07-14 ENCOUNTER — Other Ambulatory Visit: Payer: Self-pay | Admitting: Family Medicine

## 2017-07-19 ENCOUNTER — Other Ambulatory Visit: Payer: Self-pay | Admitting: Family Medicine

## 2017-08-07 ENCOUNTER — Other Ambulatory Visit: Payer: Self-pay | Admitting: Family Medicine

## 2017-08-30 DIAGNOSIS — I10 Essential (primary) hypertension: Secondary | ICD-10-CM | POA: Diagnosis not present

## 2017-08-30 DIAGNOSIS — E039 Hypothyroidism, unspecified: Secondary | ICD-10-CM | POA: Diagnosis not present

## 2017-08-30 DIAGNOSIS — R600 Localized edema: Secondary | ICD-10-CM | POA: Diagnosis not present

## 2017-09-09 ENCOUNTER — Other Ambulatory Visit: Payer: Self-pay | Admitting: Family Medicine

## 2017-10-19 DIAGNOSIS — I10 Essential (primary) hypertension: Secondary | ICD-10-CM | POA: Diagnosis not present

## 2017-10-19 DIAGNOSIS — E039 Hypothyroidism, unspecified: Secondary | ICD-10-CM | POA: Diagnosis not present

## 2017-11-09 DIAGNOSIS — Z6826 Body mass index (BMI) 26.0-26.9, adult: Secondary | ICD-10-CM | POA: Diagnosis not present

## 2017-11-09 DIAGNOSIS — I10 Essential (primary) hypertension: Secondary | ICD-10-CM | POA: Diagnosis not present

## 2017-11-09 DIAGNOSIS — R600 Localized edema: Secondary | ICD-10-CM | POA: Diagnosis not present

## 2017-11-09 DIAGNOSIS — G9009 Other idiopathic peripheral autonomic neuropathy: Secondary | ICD-10-CM | POA: Diagnosis not present

## 2017-11-09 DIAGNOSIS — Z0001 Encounter for general adult medical examination with abnormal findings: Secondary | ICD-10-CM | POA: Diagnosis not present

## 2017-11-09 DIAGNOSIS — E039 Hypothyroidism, unspecified: Secondary | ICD-10-CM | POA: Diagnosis not present

## 2017-11-09 DIAGNOSIS — E782 Mixed hyperlipidemia: Secondary | ICD-10-CM | POA: Diagnosis not present

## 2017-11-23 DIAGNOSIS — I10 Essential (primary) hypertension: Secondary | ICD-10-CM | POA: Diagnosis not present

## 2017-11-23 DIAGNOSIS — Z6824 Body mass index (BMI) 24.0-24.9, adult: Secondary | ICD-10-CM | POA: Diagnosis not present

## 2017-11-23 DIAGNOSIS — Z79899 Other long term (current) drug therapy: Secondary | ICD-10-CM | POA: Diagnosis not present

## 2017-11-23 DIAGNOSIS — R600 Localized edema: Secondary | ICD-10-CM | POA: Diagnosis not present

## 2017-12-13 DIAGNOSIS — Z23 Encounter for immunization: Secondary | ICD-10-CM | POA: Diagnosis not present

## 2018-01-24 DIAGNOSIS — I10 Essential (primary) hypertension: Secondary | ICD-10-CM | POA: Diagnosis not present

## 2018-01-24 DIAGNOSIS — R600 Localized edema: Secondary | ICD-10-CM | POA: Diagnosis not present

## 2018-01-24 DIAGNOSIS — E039 Hypothyroidism, unspecified: Secondary | ICD-10-CM | POA: Diagnosis not present

## 2018-01-24 DIAGNOSIS — E782 Mixed hyperlipidemia: Secondary | ICD-10-CM | POA: Diagnosis not present

## 2018-02-05 DIAGNOSIS — I10 Essential (primary) hypertension: Secondary | ICD-10-CM | POA: Diagnosis not present

## 2018-02-05 DIAGNOSIS — E782 Mixed hyperlipidemia: Secondary | ICD-10-CM | POA: Diagnosis not present

## 2018-03-11 DIAGNOSIS — Z1329 Encounter for screening for other suspected endocrine disorder: Secondary | ICD-10-CM | POA: Diagnosis not present

## 2018-03-11 DIAGNOSIS — Z1321 Encounter for screening for nutritional disorder: Secondary | ICD-10-CM | POA: Diagnosis not present

## 2018-03-11 DIAGNOSIS — E782 Mixed hyperlipidemia: Secondary | ICD-10-CM | POA: Diagnosis not present

## 2018-03-11 DIAGNOSIS — E039 Hypothyroidism, unspecified: Secondary | ICD-10-CM | POA: Diagnosis not present

## 2018-03-11 DIAGNOSIS — Z6824 Body mass index (BMI) 24.0-24.9, adult: Secondary | ICD-10-CM | POA: Diagnosis not present

## 2018-03-11 DIAGNOSIS — Z79899 Other long term (current) drug therapy: Secondary | ICD-10-CM | POA: Diagnosis not present

## 2018-03-11 DIAGNOSIS — I10 Essential (primary) hypertension: Secondary | ICD-10-CM | POA: Diagnosis not present

## 2018-03-15 DIAGNOSIS — I10 Essential (primary) hypertension: Secondary | ICD-10-CM | POA: Diagnosis not present

## 2018-03-15 DIAGNOSIS — E039 Hypothyroidism, unspecified: Secondary | ICD-10-CM | POA: Diagnosis not present

## 2018-03-15 DIAGNOSIS — R600 Localized edema: Secondary | ICD-10-CM | POA: Diagnosis not present

## 2018-03-15 DIAGNOSIS — M2550 Pain in unspecified joint: Secondary | ICD-10-CM | POA: Diagnosis not present

## 2018-03-15 DIAGNOSIS — R413 Other amnesia: Secondary | ICD-10-CM | POA: Diagnosis not present

## 2018-03-15 DIAGNOSIS — E782 Mixed hyperlipidemia: Secondary | ICD-10-CM | POA: Diagnosis not present

## 2018-03-15 DIAGNOSIS — G9009 Other idiopathic peripheral autonomic neuropathy: Secondary | ICD-10-CM | POA: Diagnosis not present

## 2018-04-03 ENCOUNTER — Ambulatory Visit (INDEPENDENT_AMBULATORY_CARE_PROVIDER_SITE_OTHER): Payer: PPO

## 2018-04-03 ENCOUNTER — Ambulatory Visit: Payer: PPO | Admitting: Orthopaedic Surgery

## 2018-04-03 ENCOUNTER — Encounter: Payer: Self-pay | Admitting: Orthopaedic Surgery

## 2018-04-03 VITALS — BP 172/94 | HR 77 | Ht 60.0 in | Wt 134.0 lb

## 2018-04-03 DIAGNOSIS — M25572 Pain in left ankle and joints of left foot: Secondary | ICD-10-CM

## 2018-04-03 DIAGNOSIS — M25511 Pain in right shoulder: Secondary | ICD-10-CM

## 2018-04-03 NOTE — Progress Notes (Signed)
Subjective:    Patient ID: Jeanette Brady, female    DOB: 07-28-1929, 85 y.o.   MRN: [id number]  HPI She has multiple joint complaints:  The left heel hurts, both knees are tender, the wrists are tender and the right shoulder hurts.  She says the shoulder and the heel are the most painful.  She has no trauma.  She has more pain with cold weather.  She has seen Dr. Wende Neighbors and I have reviewed his office notes.  She has had no recent x-rays.  She has tried Advil, Tylenol, heat and ice and rubs with slight help.  She uses a cane at times.  She has no redness, no numbness.     Review of Systems  Constitutional: Positive for activity change.  Musculoskeletal: Positive for arthralgias, back pain, gait problem and joint swelling.  All other systems reviewed and are negative.  For Review of Systems, all other systems reviewed and are negative.  The following is a summary of the past history medically, past history surgically, known current medicines, social history and family history.  This information is gathered electronically by the computer from prior information and documentation.  I review this each visit and have found including this information at this point in the chart is beneficial and informative.   Past Medical History:  Diagnosis Date  . Anemia   . Arthritis   . Cataract   . Chronic kidney disease   . Heart murmur   . Hypertension   . 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 Thyroid disease     Past Surgical History:  Procedure Laterality Date  . BACK SURGERY    . EYE SURGERY    . JOINT REPLACEMENT    . REPLACEMENT TOTAL KNEE BILATERAL    . SPINE SURGERY    . YAG LASER APPLICATION Right 7/51/7001   Procedure: YAG LASER APPLICATION;  Surgeon: Rutherford Guys, MD;  Location: [facility];  Service: Ophthalmology;  Laterality: Right;    Current Outpatient Medications on File Prior to Visit  Medication Sig Dispense Refill  . gabapentin (NEURONTIN) 300 MG capsule TAKE 1 CAPSULE  BY MOUTH THREE TIMES A DAY 90 capsule 0  . ibuprofen (ADVIL,MOTRIN) 600 MG tablet Take 1 tablet (600 mg total) by mouth every 8 (eight) hours as needed. Arthritis pain 50 tablet 3  . losartan (COZAAR) 100 MG tablet TAKE 1 TABLET BY MOUTH EVERY DAY 30 tablet 0  . alendronate (FOSAMAX) 70 MG tablet Take 70 mg by mouth once a week. Take with a full glass of water on an empty stomach.    Marland Kitchen amLODipine (NORVASC) 5 MG tablet TAKE 1 TABLET EVERY DAY 90 tablet 3  . aspirin EC 81 MG tablet Take 81 mg by mouth daily.    . Cholecalciferol (VITAMIN D3) 50000 units CAPS Take 1 capsule by mouth every 30 (thirty) days. 4 capsule 3  . furosemide (LASIX) 20 MG tablet Take 1 tablet (20 mg total) by mouth daily. 90 tablet 3  . levothyroxine (SYNTHROID, LEVOTHROID) 100 MCG tablet TAKE 1 TABLET BY MOUTH EVERY DAY 30 tablet 0  . traMADol-acetaminophen (ULTRACET) 37.5-325 MG tablet TAKE 1 TABLET BY MOUTH EVERY 4 HOURS AS NEEDED 50 tablet 0   Current Facility-Administered Medications on File Prior to Visit  Medication Dose Route Frequency Provider Last Rate Last Dose  . methylPREDNISolone acetate (DEPO-MEDROL) injection 80 mg  80 mg Intra-articular Once Raylene Everts, MD        Social History  Socioeconomic History  . Marital status: Married    Spouse name: Not on file  . Number of children: Not on file  . Years of education: Not on file  . Highest education level: Not on file  Occupational History  . Not on file  Social Needs  . Financial resource strain: Not on file  . Food insecurity:    Worry: Not on file    Inability: Not on file  . Transportation needs:    Medical: Not on file    Non-medical: Not on file  Tobacco Use  . Smoking status: Former Smoker    Packs/day: 0.05    Years: 20.00    Pack years: 1.00    Types: Cigarettes    Last attempt to quit: 03/06/1993    Years since quitting: 25.0  . Smokeless tobacco: Current User    Types: Snuff  Substance and Sexual Activity  . Alcohol use:  No  . Drug use: No  . Sexual activity: Not Currently  Lifestyle  . Physical activity:    Days per week: Not on file    Minutes per session: Not on file  . Stress: Not on file  Relationships  . Social connections:    Talks on phone: Not on file    Gets together: Not on file    Attends religious service: Not on file    Active member of club or organization: Not on file    Attends meetings of clubs or organizations: Not on file    Relationship status: Not on file  . Intimate partner violence:    Fear of current or ex partner: Not on file    Emotionally abused: Not on file    Physically abused: Not on file    Forced sexual activity: Not on file  Other Topics Concern  . Not on file  Social History Narrative  . Not on file    Family History  Problem Relation Age of Onset  . Heart attack Mother   . Heart disease Mother   . Kidney disease Mother   . Kidney disease Sister        on dialysis   . Hypertension Daughter   . Hypercholesterolemia Daughter   . Anxiety disorder Daughter   . Cancer Sister   . COPD Sister   . Heart disease Sister   . Syncope episode Neg Hx     BP (!) 172/94   Pulse 77   Ht 5' (1.524 m)   Wt 134 lb (60.8 kg)   BMI 26.17 kg/m   Body mass index is 26.17 kg/m.      Objective:   Physical Exam Constitutional:      Appearance: She is well-developed.  HENT:     Head: Normocephalic and atraumatic.  Eyes:     Conjunctiva/sclera: Conjunctivae normal.     Pupils: Pupils are equal, round, and reactive to light.  Neck:     Musculoskeletal: Normal range of motion and neck supple.  Cardiovascular:     Rate and Rhythm: Normal rate and regular rhythm.  Pulmonary:     Effort: Pulmonary effort is normal.  Abdominal:     Palpations: Abdomen is soft.  Musculoskeletal:     Right shoulder: She exhibits decreased range of motion and tenderness.       Arms:     Left foot: Tenderness present.       Feet:  Skin:    General: Skin is warm and dry.    Neurological:  Mental Status: She is alert and oriented to person, place, and time.     Cranial Nerves: No cranial nerve deficit.     Motor: No abnormal muscle tone.     Coordination: Coordination normal.     Deep Tendon Reflexes: Reflexes are normal and symmetric. Reflexes normal.  Psychiatric:        Behavior: Behavior normal.        Thought Content: Thought content normal.        Judgment: Judgment normal.      X-rays were done of the left foot and right shoulder, reported separately.     Assessment & Plan:   Encounter Diagnoses  Name Primary?  . Pain of joint of left ankle and foot Yes  . Pain in joint of right shoulder    She was told about stretching and icing for the heel on the left.  She was told she has severe degenerative changes of the right shoulder.  I have recommended trial of injection.  She does not want to consider any surgery.  PROCEDURE NOTE:  The patient request injection, verbal consent was obtained.  The right shoulder was prepped appropriately after time out was performed.   Sterile technique was observed and injection of 1 cc of Depo-Medrol 40 mg with several cc's of plain xylocaine. Anesthesia was provided by ethyl chloride and a 20-gauge needle was used to inject the shoulder area. A posterior approach was used.  The injection was tolerated well.  A band aid dressing was applied.  The patient was advised to apply ice later today and tomorrow to the injection sight as needed.  She is to use Aspercreme to the shoulder and heel.  Return in one month.  Call if any problem.  Precautions discussed.   Electronically Signed Sanjuana Kava, MD 1/29/20203:40 PM

## 2018-04-12 DIAGNOSIS — I10 Essential (primary) hypertension: Secondary | ICD-10-CM | POA: Diagnosis not present

## 2018-04-12 DIAGNOSIS — G3184 Mild cognitive impairment, so stated: Secondary | ICD-10-CM | POA: Diagnosis not present

## 2018-05-01 ENCOUNTER — Ambulatory Visit: Payer: PPO | Admitting: Orthopaedic Surgery

## 2018-05-02 DIAGNOSIS — E782 Mixed hyperlipidemia: Secondary | ICD-10-CM | POA: Diagnosis not present

## 2018-05-02 DIAGNOSIS — I10 Essential (primary) hypertension: Secondary | ICD-10-CM | POA: Diagnosis not present

## 2018-05-08 ENCOUNTER — Ambulatory Visit: Payer: PPO | Admitting: Orthopaedic Surgery

## 2018-05-08 ENCOUNTER — Encounter: Payer: Self-pay | Admitting: Orthopaedic Surgery

## 2018-05-08 ENCOUNTER — Other Ambulatory Visit: Payer: Self-pay

## 2018-05-08 VITALS — BP 172/72 | HR 65 | Ht 60.0 in | Wt 134.0 lb

## 2018-05-08 DIAGNOSIS — M25511 Pain in right shoulder: Secondary | ICD-10-CM

## 2018-05-08 NOTE — Progress Notes (Signed)
PROCEDURE NOTE:  The patient request injection, verbal consent was obtained.  The right shoulder was prepped appropriately after time out was performed.   Sterile technique was observed and injection of 1 cc of Depo-Medrol 40 mg with several cc's of plain xylocaine. Anesthesia was provided by ethyl chloride and a 20-gauge needle was used to inject the shoulder area. A posterior approach was used.  The injection was tolerated well.  A band aid dressing was applied.  The patient was advised to apply ice later today and tomorrow to the injection sight as needed.  Begin PT/OT of the right shoulder.  Return in three weeks.  Encounter Diagnosis  Name Primary?  . Pain in joint of right shoulder Yes   Electronically Signed Sanjuana Kava, MD 3/4/20202:19 PM

## 2018-05-08 NOTE — Patient Instructions (Signed)
  Physical therapy has been ordered for you at [place] [phone] is the phone number to call if you want to call to schedule. Please let us know if you do not hear anything within one week.  

## 2018-05-20 ENCOUNTER — Encounter (HOSPITAL_COMMUNITY): Payer: Self-pay

## 2018-05-20 ENCOUNTER — Ambulatory Visit (HOSPITAL_COMMUNITY): Payer: PPO | Attending: Orthopaedic Surgery

## 2018-05-20 ENCOUNTER — Other Ambulatory Visit: Payer: Self-pay

## 2018-05-20 DIAGNOSIS — M25511 Pain in right shoulder: Secondary | ICD-10-CM | POA: Diagnosis not present

## 2018-05-20 DIAGNOSIS — G8929 Other chronic pain: Secondary | ICD-10-CM | POA: Diagnosis not present

## 2018-05-20 DIAGNOSIS — M25611 Stiffness of right shoulder, not elsewhere classified: Secondary | ICD-10-CM | POA: Insufficient documentation

## 2018-05-20 DIAGNOSIS — R29898 Other symptoms and signs involving the musculoskeletal system: Secondary | ICD-10-CM | POA: Insufficient documentation

## 2018-05-20 NOTE — Patient Instructions (Signed)
SHOULDER: Flexion On Table   Place hands on towel placed on table, elbows straight. Lean forward with you upper body, pushing towel away from body. 10-15 times. 1 set. 2-3 times a day.  Abduction (Passive)   With arm out to side, resting on towel placed on table, keeping trunk away from table, lean to the side while pushing towel away from body. 10-15 times. 1 set. 2-3 times a day. Copyright  VHI. All rights reserved.     Internal Rotation (Assistive)   Seated with elbow bent at right angle and held against side, slide arm on table surface in an inward arc keeping elbow anchored in place. 10-15 times. 1 set. 2-3 times a day. Activity: Use this motion to brush crumbs off the table.  Copyright  VHI. All rights reserved.

## 2018-05-20 NOTE — Therapy (Signed)
[place], [place], [address] Phone: [phone]   Fax:  [phone]  Occupational Therapy Evaluation  Patient Details  Name: Jeanette Brady MRN: [id number] Date of Birth: 10-25-1929 Referring Provider (OT): Sanjuana Kava, MD   Encounter Date: 05/20/2018  OT End of Session - 05/20/18 1547    Visit Number  1    Number of Visits  8    Date for OT Re-Evaluation  06/17/18    Authorization Type  [employer]    Authorization Time Period  No visit limit. No authorization required. $15 copay    OT Start Time  1445    OT Stop Time  1525    OT Time Calculation (min)  40 min    Activity Tolerance  Patient tolerated treatment well    Behavior During Therapy  WFL for tasks assessed/performed       Past Medical History:  Diagnosis Date  . Anemia   . Arthritis   . Cataract   . Chronic kidney disease   . Heart murmur   . Hypertension   . 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 Thyroid disease     Past Surgical History:  Procedure Laterality Date  . BACK SURGERY    . EYE SURGERY    . JOINT REPLACEMENT    . REPLACEMENT TOTAL KNEE BILATERAL    . SPINE SURGERY    . YAG LASER APPLICATION Right 09/11/6281   Procedure: YAG LASER APPLICATION;  Surgeon: Rutherford Guys, MD;  Location: [facility];  Service: Ophthalmology;  Laterality: Right;    There were no vitals filed for this visit.  Subjective Assessment - 05/20/18 1452    Subjective   S: I can't lift it to do anything.    Patient is accompanied by:  Family member   Daughter: Jeanette Brady   Pertinent History  Patient is a 85 y/o female S/P right shoulder pain which began approximately 6 months ago without any injury. X-ray was completed which showed degenerative changes in the [facility] joint. Dr. Luna Glasgow has referred patient to occupational therapy for evaluation and treatment.     Patient Stated Goals  TO be able to use her arm better.     Currently in Pain?  Yes   0/10 when  sitting   Pain Score  6     Pain Location  Shoulder    Pain Orientation  Right    Pain Descriptors / Indicators  Aching;Sharp    Pain Type  Chronic pain    Pain Radiating Towards  shoulder to elbow    Pain Onset  Other (comment)   approximately 6 months ago   Pain Frequency  Intermittent    Aggravating Factors   movement and use    Pain Relieving Factors  rest and pain medication    Effect of Pain on Daily Activities  moderately effecting ADL tasks.        [facility] OT Assessment - 05/20/18 1454      Assessment   Medical Diagnosis  Right shoulder pain    Referring Provider (OT)  Sanjuana Kava, MD    Onset Date/Surgical Date  --   ~ 6 months ago   Hand Dominance  Right    Next MD Visit  06/11/18    Prior Therapy  None for shoulder      Precautions   Precautions  None      Restrictions   Weight Bearing Restrictions  No  Balance Screen   Has the patient fallen in the past 6 months  Yes    How many times?  2    Has the patient had a decrease in activity level because of a fear of falling?   No    Is the patient reluctant to leave their home because of a fear of falling?   No      Home  Environment   Family/patient expects to be discharged to:  Private residence    Additional Comments  Daughter helps with bathing, some meal prep, and housekeeping.    Lives With  Alone      Prior Function   Level of Independence  Independent with basic ADLs    Vocation  Retired      ADL   ADL comments  Difficulty combing hair, putting shoes on, getting shirts on and off, reaching.      Mobility   Mobility Status  History of falls      Written Expression   Dominant Hand  Right      Vision - History   Baseline Vision  Other (comment)   Supposed to     Cognition   Overall Cognitive Status  Within Functional Limits for tasks assessed      Observation/Other Assessments   Focus on Therapeutic Outcomes (FOTO)   Complete next session      Posture/Postural Control   Posture/Postural  Control  Postural limitations    Postural Limitations  Rounded Shoulders;Forward head      ROM / Strength   AROM / PROM / Strength  AROM;PROM;Strength      Palpation   Palpation comment  Minimal fascial restrictions noted in right bicep, upper arm, and upper trapezius region.       AROM   Overall AROM Comments  Assessed seated. IR/er adducted    AROM Assessment Site  Shoulder    Right/Left Shoulder  Right    Right Shoulder Flexion  77 Degrees   Left: 135   Right Shoulder ABduction  57 Degrees   left: 145   Right Shoulder Internal Rotation  90 Degrees   left: 50   Right Shoulder External Rotation  35 Degrees   left: 50     PROM   Overall PROM Comments  Assessed supine. IR/er adducted    PROM Assessment Site  Shoulder    Right/Left Shoulder  Right    Right Shoulder Flexion  94 Degrees    Right Shoulder ABduction  85 Degrees    Right Shoulder Internal Rotation  90 Degrees    Right Shoulder External Rotation  45 Degrees      Strength   Overall Strength Comments  Assessed seated. IR/er adducted.    Strength Assessment Site  Shoulder    Right/Left Shoulder  Right    Right Shoulder Flexion  3-/5   left: 3+/5   Right Shoulder ABduction  3-/5   previous: 3+/5   Right Shoulder Internal Rotation  3/5   left: 3+/5   Right Shoulder External Rotation  3/5   previous: 3+/5                     OT Education - 05/20/18 1602    Education Details  reviewed evaluation findings, goal recommendation. Pt provided with table slides for HEP.     Person(s) Educated  Patient;Child(ren)    Methods  Explanation;Demonstration;Verbal cues;Handout    Comprehension  Returned demonstration;Verbalized understanding       OT  Short Term Goals - 05/20/18 1559      OT SHORT TERM GOAL #1   Title  Patient will be educated and independent with HEP to increase functional performance during daily tasks while using her RUE as her dominant extremity at the highest level of independence  possible.     Time  4    Period  Weeks    Status  New    Target Date  06/17/18      OT SHORT TERM GOAL #2   Title  Patient will increase A/ROM to [facility] in order to increase ability to complete dressing tasks with less difficulty.     Time  4    Period  Weeks    Status  New      OT SHORT TERM GOAL #3   Title  Patient will increase RUE strength to 3+/5 to return to using her right arm to push up from a chair or seat.     Time  4    Period  Weeks    Status  New      OT SHORT TERM GOAL #4   Title  Patient will decrease fascial restrictions to min amount or less in order to increase functional mobility needed to complete reaching tasks at or below shoulder level.     Time  4    Period  Weeks    Status  New      OT SHORT TERM GOAL #5   Title  Pt will report a decrease in pain level of approximately 3/10 or less when completing simple daily tasks using her RUE.     Time  4    Period  Weeks    Status  New               Plan - 05/20/18 1548    Clinical Impression Statement  A: Patient is a 85 y/o female S/P right shoulder pain causing increased fascial restrictions, decreased ROM and strength resulting in difficulty completing daily tasks using her right arm as her dominant extremity. Patient an extensive history of OA in multiple joints, long standing issues in bilateral knees, and reports a torn muscle in her shoulder although unable to identify which one. Pt reports majority of her pain is in her right bicep versus her shoulder.     OT Occupational Profile and History  Detailed Assessment- Review of Records and additional review of physical, cognitive, psychosocial history related to current functional performance    Occupational performance deficits (Please refer to evaluation for details):  ADL's;IADL's;Rest and Sleep;Leisure    Body Structure / Function / Physical Skills  ADL;Strength;Pain;UE functional use;ROM;IADL;Fascial restriction;Decreased knowledge of use of DME     Rehab Potential  Good    Clinical Decision Making  Several treatment options, min-mod task modification necessary    Comorbidities Affecting Occupational Performance:  Presence of comorbidities impacting occupational performance    Comorbidities impacting occupational performance description:  OA in multiple joints, chronic pain, possible muscle torn in right shoulder, advanced age    Modification or Assistance to Complete Evaluation   Max significant modification of tasks or assist is necessary to complete    OT Frequency  2x / week    OT Duration  4 weeks    OT Treatment/Interventions  Self-care/ADL training;Moist Heat;DME and/or AE instruction;Therapeutic activities;Ultrasound;Therapeutic exercise;Passive range of motion;Cryotherapy;Electrical Stimulation;Manual Therapy;Patient/family education;Neuromuscular education    Plan  P; patient will benefit from skilled OT services to increase functional performance during ADL tasks  while using her right UE as her dominant. Treatment Plan: Myofascial release, manual stretching, P/ROM, AA/ROM, A/ROM, general shoulder and scapular stretching.     Consulted and Agree with Plan of Care  Patient;Family member/caregiver    Family Member Consulted  Daughter, Jeanette Brady.       Patient will benefit from skilled therapeutic intervention in order to improve the following deficits and impairments:  Body Structure / Function / Physical Skills  Visit Diagnosis: Other symptoms and signs involving the musculoskeletal system - Plan: Ot plan of care cert/re-cert  Chronic right shoulder pain - Plan: Ot plan of care cert/re-cert  Stiffness of right shoulder, not elsewhere classified - Plan: Ot plan of care cert/re-cert    Problem List Patient Active Problem List   Diagnosis Date Noted  . Intention tremor 05/01/2016  . 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 Primary osteoarthritis involving multiple joints 01/25/2016  . Personal history of benign brain tumor  01/25/2016  . Syncope 10/15/2015  . Hypertension 10/15/2015  . Hypothyroidism 10/15/2015  . Heart murmur 10/15/2015  . Kidney disease 10/15/2015  . Normocytic anemia 10/15/2015  . SHOULDER PAIN 06/09/2008   Ailene Ravel, OTR/L,CBIS  [phone]  05/20/2018, 4:04 PM  [facility] [address] [place], [place], [address] Phone: [phone]   Fax:  [phone]  Name: Jeanette Brady MRN: [id number] Date of Birth: 1929/12/19

## 2018-05-22 ENCOUNTER — Encounter (HOSPITAL_COMMUNITY): Payer: Self-pay | Admitting: Occupational Therapy

## 2018-05-22 ENCOUNTER — Ambulatory Visit (HOSPITAL_COMMUNITY): Payer: PPO | Admitting: Occupational Therapy

## 2018-05-22 ENCOUNTER — Other Ambulatory Visit: Payer: Self-pay

## 2018-05-22 DIAGNOSIS — G8929 Other chronic pain: Secondary | ICD-10-CM

## 2018-05-22 DIAGNOSIS — M25511 Pain in right shoulder: Secondary | ICD-10-CM

## 2018-05-22 DIAGNOSIS — R29898 Other symptoms and signs involving the musculoskeletal system: Secondary | ICD-10-CM

## 2018-05-22 DIAGNOSIS — M25611 Stiffness of right shoulder, not elsewhere classified: Secondary | ICD-10-CM

## 2018-05-22 NOTE — Therapy (Signed)
[place], [place], [address] Phone: [phone]   Fax:  [phone]  Occupational Therapy Treatment  Patient Details  Name: Jeanette Brady MRN: [id number] Date of Birth: 07-Aug-1929 Referring Provider (OT): Sanjuana Kava, MD   Encounter Date: 05/22/2018  OT End of Session - 05/22/18 1522    Visit Number  2    Number of Visits  8    Date for OT Re-Evaluation  06/17/18    Authorization Type  [employer]    Authorization Time Period  No visit limit. No authorization required. $15 copay    OT Start Time  1749   pt arrived late   OT Stop Time  1515    OT Time Calculation (min)  38 min    Activity Tolerance  Patient tolerated treatment well    Behavior During Therapy  WFL for tasks assessed/performed       Past Medical History:  Diagnosis Date  . Anemia   . Arthritis   . Cataract   . Chronic kidney disease   . Heart murmur   . Hypertension   . 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 Thyroid disease     Past Surgical History:  Procedure Laterality Date  . BACK SURGERY    . EYE SURGERY    . JOINT REPLACEMENT    . REPLACEMENT TOTAL KNEE BILATERAL    . SPINE SURGERY    . YAG LASER APPLICATION Right 4/49/6759   Procedure: YAG LASER APPLICATION;  Surgeon: Rutherford Guys, MD;  Location: [facility];  Service: Ophthalmology;  Laterality: Right;    There were no vitals filed for this visit.  Subjective Assessment - 05/22/18 1437    Subjective   S: I can get my hand up to my head now and it doesn't hurt.     Currently in Pain?  No/denies         [facility] OT Assessment - 05/22/18 1436      Assessment   Medical Diagnosis  Right shoulder pain      Precautions   Precautions  None               OT Treatments/Exercises (OP) - 05/22/18 1440      Exercises   Exercises  Shoulder      Shoulder Exercises: Supine   Protraction  PROM;5 reps;AAROM;10 reps    Horizontal ABduction  PROM;5 reps;AAROM;10  reps    External Rotation  PROM;5 reps;AAROM;10 reps;Limitations    External Rotation Limitations  tactile guidance for form    Internal Rotation  PROM;5 reps;AAROM;10 reps    Flexion  PROM;5 reps;AAROM;10 reps    ABduction  PROM;5 reps      Shoulder Exercises: Seated   Elevation  AROM;10 reps    Extension  AROM;10 reps    Row  AROM;10 reps      Shoulder Exercises: Therapy Ball   Flexion  10 reps    ABduction  10 reps      Functional Reaching Activities   Mid Level  Pt placing clothespins on vertical and top horizontal bars of pinch tree. Placed 12 pins vertically and across entire top horizontal bar. Activity working on shoulder flexion and protraction.       Manual Therapy   Manual Therapy  Myofascial release    Manual therapy comments  completed separately from therapeutic exercises    Myofascial Release  Myofascial release to right upper arm, trapezius, and scapularis regions to decrease pain  and fascial restrictions and increase joint ROM               OT Short Term Goals - 05/22/18 1453      OT SHORT TERM GOAL #1   Title  Patient will be educated and independent with HEP to increase functional performance during daily tasks while using her RUE as her dominant extremity at the highest level of independence possible.     Time  4    Period  Weeks    Status  On-going    Target Date  06/17/18      OT SHORT TERM GOAL #2   Title  Patient will increase A/ROM to [facility] in order to increase ability to complete dressing tasks with less difficulty.     Time  4    Period  Weeks    Status  On-going      OT SHORT TERM GOAL #3   Title  Patient will increase RUE strength to 3+/5 to return to using her right arm to push up from a chair or seat.     Time  4    Period  Weeks    Status  On-going      OT SHORT TERM GOAL #4   Title  Patient will decrease fascial restrictions to min amount or less in order to increase functional mobility needed to complete reaching tasks at or  below shoulder level.     Time  4    Period  Weeks    Status  On-going      OT SHORT TERM GOAL #5   Title  Pt will report a decrease in pain level of approximately 3/10 or less when completing simple daily tasks using her RUE.     Time  4    Period  Weeks    Status  On-going               Plan - 05/22/18 1522    Clinical Impression Statement  A: Initiated myofascial release and manual techniques this session, pt with moderate sized knot at medial border of scapula with good response to manual techniques. Initiated AA/ROM in supine, therapy ball stretches, and functional reaching tasks. Also completed scapular ROM however pt with mod-max difficulty with scapular mobility. Verbal and tactile cuing for form and technique.     Body Structure / Function / Physical Skills  ADL;Strength;Pain;UE functional use;ROM;IADL;Fascial restriction;Decreased knowledge of use of DME    Plan  P: Continue with AA/ROM and attempt in sitting. Update HEP if pt able to complete appropriately        Patient will benefit from skilled therapeutic intervention in order to improve the following deficits and impairments:  Body Structure / Function / Physical Skills  Visit Diagnosis: Other symptoms and signs involving the musculoskeletal system  Chronic right shoulder pain  Stiffness of right shoulder, not elsewhere classified    Problem List Patient Active Problem List   Diagnosis Date Noted  . Intention tremor 05/01/2016  . 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 Primary osteoarthritis involving multiple joints 01/25/2016  . Personal history of benign brain tumor 01/25/2016  . Syncope 10/15/2015  . Hypertension 10/15/2015  . Hypothyroidism 10/15/2015  . Heart murmur 10/15/2015  . Kidney disease 10/15/2015  . Normocytic anemia 10/15/2015  . SHOULDER PAIN 06/09/2008   Guadelupe Sabin, OTR/L  [phone] 05/22/2018, 3:24 PM  [facility] [place], [place], [address] Phone: [phone]  Fax:  [phone]  Name: Jeanette Brady MRN: [id number] Date of Birth: 04-07-1929

## 2018-05-23 DIAGNOSIS — E782 Mixed hyperlipidemia: Secondary | ICD-10-CM | POA: Diagnosis not present

## 2018-05-23 DIAGNOSIS — R600 Localized edema: Secondary | ICD-10-CM | POA: Diagnosis not present

## 2018-05-23 DIAGNOSIS — I10 Essential (primary) hypertension: Secondary | ICD-10-CM | POA: Diagnosis not present

## 2018-05-23 DIAGNOSIS — E039 Hypothyroidism, unspecified: Secondary | ICD-10-CM | POA: Diagnosis not present

## 2018-05-24 ENCOUNTER — Telehealth (HOSPITAL_COMMUNITY): Payer: Self-pay | Admitting: Occupational Therapy

## 2018-05-24 NOTE — Telephone Encounter (Signed)
Called and spoke with dtr regarding 2 week clinic closure for COVID-19 precautions. Discussed HEP to continue, no questions.    Guadelupe Sabin, OTR/L  [phone] 05/24/2018

## 2018-05-30 ENCOUNTER — Ambulatory Visit (HOSPITAL_COMMUNITY): Payer: PPO

## 2018-05-31 ENCOUNTER — Encounter

## 2018-06-04 ENCOUNTER — Encounter (HOSPITAL_COMMUNITY): Payer: Self-pay | Admitting: Occupational Therapy

## 2018-06-06 ENCOUNTER — Telehealth (HOSPITAL_COMMUNITY): Payer: Self-pay | Admitting: Occupational Therapy

## 2018-06-06 ENCOUNTER — Encounter (HOSPITAL_COMMUNITY): Payer: Self-pay

## 2018-06-06 NOTE — Telephone Encounter (Signed)
Patient was contacted today regarding the temporary reduction of OP rehab services due to concerns for community transmission of Covid-19.  Therapist advised the patient to continue to perform their HEP and assured they had no unanswered questions at this time.   Spoke with daughter who reports pt continues to complete table slides and is using her cane to raise her arms overhead when she is lying on her back. No complaints of pain and she is using her arm during ADLs. Daughter does not feel pt is ready for updated HEP at this time, would like weekly check-in calls to update as needed.    Guadelupe Sabin, OTR/L  [phone] 06/06/2018

## 2018-06-11 ENCOUNTER — Encounter (HOSPITAL_COMMUNITY): Payer: Self-pay | Admitting: Occupational Therapy

## 2018-06-11 ENCOUNTER — Ambulatory Visit: Payer: PPO | Admitting: Orthopaedic Surgery

## 2018-06-11 ENCOUNTER — Other Ambulatory Visit: Payer: Self-pay

## 2018-06-11 ENCOUNTER — Encounter: Payer: Self-pay | Admitting: Orthopaedic Surgery

## 2018-06-11 VITALS — Temp 98.1°F

## 2018-06-11 DIAGNOSIS — E782 Mixed hyperlipidemia: Secondary | ICD-10-CM | POA: Diagnosis not present

## 2018-06-11 DIAGNOSIS — E039 Hypothyroidism, unspecified: Secondary | ICD-10-CM | POA: Diagnosis not present

## 2018-06-11 DIAGNOSIS — M1712 Unilateral primary osteoarthritis, left knee: Secondary | ICD-10-CM | POA: Diagnosis not present

## 2018-06-11 DIAGNOSIS — I10 Essential (primary) hypertension: Secondary | ICD-10-CM | POA: Diagnosis not present

## 2018-06-11 DIAGNOSIS — M25511 Pain in right shoulder: Secondary | ICD-10-CM

## 2018-06-11 DIAGNOSIS — R600 Localized edema: Secondary | ICD-10-CM | POA: Diagnosis not present

## 2018-06-11 DIAGNOSIS — M1711 Unilateral primary osteoarthritis, right knee: Secondary | ICD-10-CM | POA: Diagnosis not present

## 2018-06-11 NOTE — Patient Instructions (Signed)
Steps to Quit Smoking    Smoking tobacco can be bad for your health. It can also affect almost every organ in your body. Smoking puts you and people around you at risk for many serious long-lasting (chronic) diseases. Quitting smoking is hard, but it is one of the best things that you can do for your health. It is never too late to quit.  What are the benefits of quitting smoking?  When you quit smoking, you lower your risk for getting serious diseases and conditions. They can include:  · Lung cancer or lung disease.  · Heart disease.  · Stroke.  · Heart attack.  · Not being able to have children (infertility).  · Weak bones (osteoporosis) and broken bones (fractures).  If you have coughing, wheezing, and shortness of breath, those symptoms may get better when you quit. You may also get sick less often. If you are pregnant, quitting smoking can help to lower your chances of having a baby of low birth weight.  What can I do to help me quit smoking?  Talk with your doctor about what can help you quit smoking. Some things you can do (strategies) include:  · Quitting smoking totally, instead of slowly cutting back how much you smoke over a period of time.  · Going to in-person counseling. You are more likely to quit if you go to many counseling sessions.  · Using resources and support systems, such as:  ? Online chats with a counselor.  ? Phone quitlines.  ? Printed self-help materials.  ? Support groups or group counseling.  ? Text messaging programs.  ? Mobile phone apps or applications.  · Taking medicines. Some of these medicines may have nicotine in them. If you are pregnant or breastfeeding, do not take any medicines to quit smoking unless your doctor says it is okay. Talk with your doctor about counseling or other things that can help you.  Talk with your doctor about using more than one strategy at the same time, such as taking medicines while you are also going to in-person counseling. This can help make  quitting easier.  What things can I do to make it easier to quit?  Quitting smoking might feel very hard at first, but there is a lot that you can do to make it easier. Take these steps:  · Talk to your family and friends. Ask them to support and encourage you.  · Call phone quitlines, reach out to support groups, or work with a counselor.  · Ask people who smoke to not smoke around you.  · Avoid places that make you want (trigger) to smoke, such as:  ? Bars.  ? Parties.  ? Smoke-break areas at work.  · Spend time with people who do not smoke.  · Lower the stress in your life. Stress can make you want to smoke. Try these things to help your stress:  ? Getting regular exercise.  ? Deep-breathing exercises.  ? Yoga.  ? Meditating.  ? Doing a body scan. To do this, close your eyes, focus on one area of your body at a time from head to toe, and notice which parts of your body are tense. Try to relax the muscles in those areas.  · Download or buy apps on your mobile phone or tablet that can help you stick to your quit plan. There are many free apps, such as QuitGuide from the CDC (Centers for Disease Control and Prevention). You can find more   support from smokefree.gov and other websites.  This information is not intended to replace advice given to you by your health care provider. Make sure you discuss any questions you have with your health care provider.  Document Released: 12/17/2008 Document Revised: 10/19/2015 Document Reviewed: 07/07/2014  Elsevier Interactive Patient Education © 2019 [employer]vier Inc.

## 2018-06-11 NOTE — Progress Notes (Signed)
PROCEDURE NOTE:  The patient request injection, verbal consent was obtained.  The right shoulder was prepped appropriately after time out was performed.   Sterile technique was observed and injection of 1 cc of Depo-Medrol 40 mg with several cc's of plain xylocaine. Anesthesia was provided by ethyl chloride and a 20-gauge needle was used to inject the shoulder area. A posterior approach was used.  The injection was tolerated well.  A band aid dressing was applied.  The patient was advised to apply ice later today and tomorrow to the injection sight as needed.  I will see her in six weeks.  Electronically Signed Sanjuana Kava, MD 4/7/20208:57 AM

## 2018-06-13 ENCOUNTER — Telehealth (HOSPITAL_COMMUNITY): Payer: Self-pay | Admitting: Occupational Therapy

## 2018-06-13 NOTE — Telephone Encounter (Signed)
Weekly call to pt and spoke with daughter, reports pt had injection on 4/7 and is continuing to complete HEP. Declined updated HEP at this time, would like continued weekly calls.    Guadelupe Sabin, OTR/L  [phone] 06/13/2018

## 2018-06-14 ENCOUNTER — Encounter (HOSPITAL_COMMUNITY): Payer: Self-pay

## 2018-06-18 ENCOUNTER — Encounter (HOSPITAL_COMMUNITY): Payer: Self-pay

## 2018-06-20 ENCOUNTER — Encounter (HOSPITAL_COMMUNITY): Payer: Self-pay

## 2018-06-21 ENCOUNTER — Encounter (HOSPITAL_COMMUNITY): Payer: Self-pay | Admitting: Occupational Therapy

## 2018-06-21 NOTE — Therapy (Signed)
[place], [place], [address] Phone: [phone]   Fax:  [phone]  Patient Details  Name: Jeanette Brady MRN: [id number] Date of Birth: January 02, 1930 Referring Provider:  No ref. provider found  Encounter Date: 06/21/2018   Weekly call to pt: spoke with daughter who reports pt is doing well. Complete her HEP however continues to have pain throughout the day, injection did not help much. Discussed sending updated HEP which daughter would like, asked daughter to contact office with any questions. Mailed HEP on 06/21/18. HEP included the following  Supine AA/ROM Protraction Flexion IR/er Horizontal abduction  Instructions to complete 10x each, 1-2x/day   Guadelupe Sabin, OTR/L  [phone] 06/21/2018, 2:44 PM  [place], [place], [address] Phone: [phone]   Fax:  [phone]

## 2018-06-28 ENCOUNTER — Telehealth (HOSPITAL_COMMUNITY): Payer: Self-pay

## 2018-06-28 NOTE — Telephone Encounter (Signed)
Spoke with patient's daughter, Izell [place] for her weekly phone call concerning her RUE. Daughter reports that patient fell the other day on her arm and it has been sore from that. She has not completed her HEP since her fall as she is waiting for the swelling/soreness to go down. She has not attempted the new exercises although they do have them.  Educated daughter that if the RUE continues to be more sore since her fall and she is unable to complete the new exercises she may continue completing the table slides that were provided at initial evaluation. Daughter verbalized understanding. No further questions.   Ailene Ravel, OTR/L,CBIS  [phone]

## 2018-07-05 ENCOUNTER — Telehealth (HOSPITAL_COMMUNITY): Payer: Self-pay | Admitting: Occupational Therapy

## 2018-07-05 DIAGNOSIS — E782 Mixed hyperlipidemia: Secondary | ICD-10-CM | POA: Diagnosis not present

## 2018-07-05 DIAGNOSIS — I10 Essential (primary) hypertension: Secondary | ICD-10-CM | POA: Diagnosis not present

## 2018-07-05 DIAGNOSIS — R600 Localized edema: Secondary | ICD-10-CM | POA: Diagnosis not present

## 2018-07-05 DIAGNOSIS — E039 Hypothyroidism, unspecified: Secondary | ICD-10-CM | POA: Diagnosis not present

## 2018-07-05 NOTE — Telephone Encounter (Signed)
Spoke with pt's daughter, Jeanette Brady. She reports pt is feeling better after her fall last week and has resumed her exercises. Pt's pain is well controlled and her shoulder does not seem to be bothering her too bad. OT will continue to follow up and update HEP as needed.    Guadelupe Sabin, OTR/L  [phone] 07/05/2018

## 2018-07-12 ENCOUNTER — Telehealth (HOSPITAL_COMMUNITY): Payer: Self-pay | Admitting: Occupational Therapy

## 2018-07-12 NOTE — Telephone Encounter (Signed)
Called and spoke with daughter, Izell [place], to offer an in-clinic appointment to follow up on shoulder. Izell [place] will speak with pt and call back Monday to let us know whether to schedule or discharge.    Guadelupe Sabin, OTR/L  [phone] 07/12/2018

## 2018-07-23 ENCOUNTER — Ambulatory Visit: Payer: Self-pay | Admitting: Orthopaedic Surgery

## 2018-07-25 ENCOUNTER — Encounter: Payer: Self-pay | Admitting: Orthopaedic Surgery

## 2018-07-25 ENCOUNTER — Ambulatory Visit: Payer: PPO | Admitting: Orthopaedic Surgery

## 2018-07-25 ENCOUNTER — Other Ambulatory Visit: Payer: Self-pay

## 2018-07-25 VITALS — BP 153/74 | HR 68 | Temp 96.9°F | Ht 60.0 in | Wt 134.0 lb

## 2018-07-25 DIAGNOSIS — M25511 Pain in right shoulder: Secondary | ICD-10-CM | POA: Diagnosis not present

## 2018-07-25 NOTE — Progress Notes (Signed)
PROCEDURE NOTE:  The patient request injection, verbal consent was obtained.  The right shoulder was prepped appropriately after time out was performed.   Sterile technique was observed and injection of 1 cc of Depo-Medrol 40 mg with several cc's of plain xylocaine. Anesthesia was provided by ethyl chloride and a 20-gauge needle was used to inject the shoulder area. A posterior approach was used.  The injection was tolerated well.  A band aid dressing was applied.  The patient was advised to apply ice later today and tomorrow to the injection sight as needed.  Return in six weeks.  Electronically Signed Sanjuana Kava, MD 5/21/20209:55 AM

## 2018-08-06 ENCOUNTER — Ambulatory Visit: Payer: PPO | Admitting: Orthopaedic Surgery

## 2018-08-14 DIAGNOSIS — E039 Hypothyroidism, unspecified: Secondary | ICD-10-CM | POA: Diagnosis not present

## 2018-08-14 DIAGNOSIS — I10 Essential (primary) hypertension: Secondary | ICD-10-CM | POA: Diagnosis not present

## 2018-08-14 DIAGNOSIS — E782 Mixed hyperlipidemia: Secondary | ICD-10-CM | POA: Diagnosis not present

## 2018-08-14 DIAGNOSIS — R600 Localized edema: Secondary | ICD-10-CM | POA: Diagnosis not present

## 2018-08-16 ENCOUNTER — Encounter (HOSPITAL_COMMUNITY): Payer: Self-pay | Admitting: Occupational Therapy

## 2018-08-16 NOTE — Therapy (Signed)
[place], [place], [address] Phone: [phone]   Fax:  [phone]  Patient Details  Name: Jeanette Brady MRN: [id number] Date of Birth: [birth year] Referring Provider:  No ref. provider found  Encounter Date: 08/16/2018   OCCUPATIONAL THERAPY DISCHARGE SUMMARY  Visits from Start of Care: 2  Current functional level related to goals / functional outcomes: Unknown. Pt has not returned since covid clinic closure. Daughter was contacted regarding resuming visits however never called back to let us know decision. Pt will be discharged at this time.    Remaining deficits: unknown   Education / Equipment: HEP Plan: Patient agrees to discharge.  Patient goals were not met. Patient is being discharged due to not returning since the last visit.  ?????      Guadelupe Sabin, OTR/L  [phone] 08/16/2018, 10:52 AM  [place], [place], [address] Phone: [phone]   Fax:  [phone]

## 2018-09-05 ENCOUNTER — Other Ambulatory Visit: Payer: Self-pay

## 2018-09-05 ENCOUNTER — Encounter: Payer: Self-pay | Admitting: Orthopaedic Surgery

## 2018-09-05 ENCOUNTER — Ambulatory Visit (INDEPENDENT_AMBULATORY_CARE_PROVIDER_SITE_OTHER): Payer: PPO | Admitting: Orthopaedic Surgery

## 2018-09-05 VITALS — BP 163/67 | HR 71 | Ht 61.0 in | Wt 134.4 lb

## 2018-09-05 DIAGNOSIS — G8929 Other chronic pain: Secondary | ICD-10-CM | POA: Diagnosis not present

## 2018-09-05 DIAGNOSIS — M25512 Pain in left shoulder: Secondary | ICD-10-CM

## 2018-09-05 NOTE — Progress Notes (Signed)
Patient LD:JTTS Jeanette Brady, female DOB:12/29/1929, 85 y.o. [id number]  Chief Complaint  Patient presents with  . Shoulder Pain    Bilat/Jeanette hurts bad/R not hurting so much    HPI  Jeanette Brady is a 85 y.o. female who has been seen for the right shoulder and shoulder pain. She did well with injection there two months ago.  She has developed pain in the left shoulder now. She has no welling, no redness. She has no trauma.  It hurts with use of hand over head and sleeping.  She has no numbness.   Body mass index is 25.39 kg/m.  ROS  Review of Systems  Constitutional: Positive for activity change.  Musculoskeletal: Positive for arthralgias, back pain, gait problem and joint swelling.  All other systems reviewed and are negative.   All other systems reviewed and are negative.  The following is a summary of the past history medically, past history surgically, known current medicines, social history and family history.  This information is gathered electronically by the computer from prior information and documentation.  I review this each visit and have found including this information at this point in the chart is beneficial and informative.    Past Medical History:  Diagnosis Date  . Anemia   . Arthritis   . Cataract   . Chronic kidney disease   . Heart murmur   . Hypertension   . 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 Thyroid disease     Past Surgical History:  Procedure Laterality Date  . BACK SURGERY    . EYE SURGERY    . JOINT REPLACEMENT    . REPLACEMENT TOTAL KNEE BILATERAL    . SPINE SURGERY    . YAG LASER APPLICATION Right 06/02/760   Procedure: YAG LASER APPLICATION;  Surgeon: Rutherford Guys, MD;  Location: [facility];  Service: Ophthalmology;  Laterality: Right;    Family History  Problem Relation Age of Onset  . Heart attack Mother   . Heart disease Mother   . Kidney disease Mother   . Kidney disease Sister        on dialysis   . Hypertension Daughter   .  Hypercholesterolemia Daughter   . Anxiety disorder Daughter   . Cancer Sister   . COPD Sister   . Heart disease Sister   . Syncope episode Neg Hx     Social History Social History   Tobacco Use  . Smoking status: Former Smoker    Packs/day: 0.05    Years: 20.00    Pack years: 1.00    Types: Cigarettes    Quit date: 03/06/1993    Years since quitting: 25.5  . Smokeless tobacco: Current User    Types: Snuff  Substance Use Topics  . Alcohol use: No  . Drug use: No    No Known Allergies  Current Outpatient Medications  Medication Sig Dispense Refill  . alendronate (FOSAMAX) 70 MG tablet Take 70 mg by mouth once a week. Take with a full glass of water on an empty stomach.    Marland Kitchen amLODipine (NORVASC) 5 MG tablet TAKE 1 TABLET EVERY DAY 90 tablet 3  . aspirin EC 81 MG tablet Take 81 mg by mouth daily.    . Cholecalciferol (VITAMIN D3) 50000 units CAPS Take 1 capsule by mouth every 30 (thirty) days. 4 capsule 3  . furosemide (LASIX) 20 MG tablet Take 1 tablet (20 mg total) by mouth daily. 90 tablet 3  . gabapentin (NEURONTIN) 300  MG capsule TAKE 1 CAPSULE BY MOUTH THREE TIMES A DAY 90 capsule 0  . ibuprofen (ADVIL,MOTRIN) 600 MG tablet Take 1 tablet (600 mg total) by mouth every 8 (eight) hours as needed. Arthritis pain 50 tablet 3  . levothyroxine (SYNTHROID, LEVOTHROID) 100 MCG tablet TAKE 1 TABLET BY MOUTH EVERY DAY 30 tablet 0  . losartan (COZAAR) 100 MG tablet TAKE 1 TABLET BY MOUTH EVERY DAY 30 tablet 0  . memantine (NAMENDA) 10 MG tablet     . traMADol-acetaminophen (ULTRACET) 37.5-325 MG tablet TAKE 1 TABLET BY MOUTH EVERY 4 HOURS AS NEEDED 50 tablet 0  . valsartan (DIOVAN) 320 MG tablet      No current facility-administered medications for this visit.      Physical Exam  Blood pressure (!) 163/67, pulse 71, height 5\' 1"  (1.549 m), weight 134 lb 6 oz (61 kg).  Constitutional: overall normal hygiene, normal nutrition, well developed, normal grooming, normal body  habitus. Assistive device:walker  Musculoskeletal: gait and station Limp right, muscle tone and strength are normal, no tremors or atrophy is present.  .  Neurological: coordination overall normal.  Deep tendon reflex/nerve stretch intact.  Sensation normal.  Cranial nerves II-XII intact.   Skin:   Normal overall no scars, lesions, ulcers or rashes. No psoriasis.  Psychiatric: Alert and oriented x 3.  Recent memory intact, remote memory unclear.  Normal mood and affect. Well groomed.  Good eye contact.  Cardiovascular: overall no swelling, no varicosities, no edema bilaterally, normal temperatures of the legs and arms, no clubbing, cyanosis and good capillary refill.  Lymphatic: palpation is normal.  Right shoulder has near full ROM.  Examination of left Upper Extremity is done.  Inspection:   Overall:  Elbow non-tender without crepitus or defects, forearm non-tender without crepitus or defects, wrist non-tender without crepitus or defects, hand non-tender.    Shoulder: with glenohumeral joint tenderness, without effusion.   Upper arm:  without swelling and tenderness   Range of motion:   Overall:  Full range of motion of the elbow, full range of motion of wrist and full range of motion in fingers.   Shoulder:  left  100 degrees forward flexion; 90 degrees abduction; 25 degrees internal rotation, 25 degrees external rotation, 15 degrees extension, 40 degrees adduction.   Stability:   Overall:  Shoulder, elbow and wrist stable   Strength and Tone:   Overall full shoulder muscles strength, full upper arm strength and normal upper arm bulk and tone. All other systems reviewed and are negative   The patient has been educated about the nature of the problem(s) and counseled on treatment options.  The patient appeared to understand what I have discussed and is in agreement with it.  Encounter Diagnosis  Name Primary?  . Chronic left shoulder pain Yes   PROCEDURE NOTE:  The patient  request injection, verbal consent was obtained.  The left shoulder was prepped appropriately after time out was performed.   Sterile technique was observed and injection of 1 cc of Depo-Medrol 40 mg with several cc's of plain xylocaine. Anesthesia was provided by ethyl chloride and a 20-gauge needle was used to inject the shoulder area. A posterior approach was used.  The injection was tolerated well.  A band aid dressing was applied.  The patient was advised to apply ice later today and tomorrow to the injection sight as needed.    PLAN Call if any problems.  Precautions discussed.  Continue current medications.   Return to  clinic 2 months   Electronically Signed Sanjuana Kava, MD 7/2/20209:33 AM

## 2018-09-06 DIAGNOSIS — R600 Localized edema: Secondary | ICD-10-CM | POA: Diagnosis not present

## 2018-09-06 DIAGNOSIS — E039 Hypothyroidism, unspecified: Secondary | ICD-10-CM | POA: Diagnosis not present

## 2018-09-06 DIAGNOSIS — E782 Mixed hyperlipidemia: Secondary | ICD-10-CM | POA: Diagnosis not present

## 2018-09-06 DIAGNOSIS — I10 Essential (primary) hypertension: Secondary | ICD-10-CM | POA: Diagnosis not present

## 2018-09-20 DIAGNOSIS — E782 Mixed hyperlipidemia: Secondary | ICD-10-CM | POA: Diagnosis not present

## 2018-09-20 DIAGNOSIS — I10 Essential (primary) hypertension: Secondary | ICD-10-CM | POA: Diagnosis not present

## 2018-09-20 DIAGNOSIS — E039 Hypothyroidism, unspecified: Secondary | ICD-10-CM | POA: Diagnosis not present

## 2018-09-25 DIAGNOSIS — E039 Hypothyroidism, unspecified: Secondary | ICD-10-CM | POA: Diagnosis not present

## 2018-09-25 DIAGNOSIS — E782 Mixed hyperlipidemia: Secondary | ICD-10-CM | POA: Diagnosis not present

## 2018-09-25 DIAGNOSIS — D530 Protein deficiency anemia: Secondary | ICD-10-CM | POA: Diagnosis not present

## 2018-09-25 DIAGNOSIS — R600 Localized edema: Secondary | ICD-10-CM | POA: Diagnosis not present

## 2018-09-25 DIAGNOSIS — R011 Cardiac murmur, unspecified: Secondary | ICD-10-CM | POA: Diagnosis not present

## 2018-09-25 DIAGNOSIS — M25512 Pain in left shoulder: Secondary | ICD-10-CM | POA: Diagnosis not present

## 2018-09-25 DIAGNOSIS — M65321 Trigger finger, right index finger: Secondary | ICD-10-CM | POA: Diagnosis not present

## 2018-09-25 DIAGNOSIS — I10 Essential (primary) hypertension: Secondary | ICD-10-CM | POA: Diagnosis not present

## 2018-09-25 DIAGNOSIS — M25511 Pain in right shoulder: Secondary | ICD-10-CM | POA: Diagnosis not present

## 2018-09-25 DIAGNOSIS — G3184 Mild cognitive impairment, so stated: Secondary | ICD-10-CM | POA: Diagnosis not present

## 2018-09-25 DIAGNOSIS — E441 Mild protein-calorie malnutrition: Secondary | ICD-10-CM | POA: Diagnosis not present

## 2018-09-25 DIAGNOSIS — G9009 Other idiopathic peripheral autonomic neuropathy: Secondary | ICD-10-CM | POA: Diagnosis not present

## 2018-10-14 DIAGNOSIS — I10 Essential (primary) hypertension: Secondary | ICD-10-CM | POA: Diagnosis not present

## 2018-10-14 DIAGNOSIS — E782 Mixed hyperlipidemia: Secondary | ICD-10-CM | POA: Diagnosis not present

## 2018-10-14 DIAGNOSIS — R600 Localized edema: Secondary | ICD-10-CM | POA: Diagnosis not present

## 2018-10-14 DIAGNOSIS — E039 Hypothyroidism, unspecified: Secondary | ICD-10-CM | POA: Diagnosis not present

## 2018-11-07 ENCOUNTER — Encounter: Payer: Self-pay | Admitting: Orthopaedic Surgery

## 2018-11-07 ENCOUNTER — Other Ambulatory Visit: Payer: Self-pay

## 2018-11-07 ENCOUNTER — Ambulatory Visit (INDEPENDENT_AMBULATORY_CARE_PROVIDER_SITE_OTHER): Payer: PPO | Admitting: Orthopaedic Surgery

## 2018-11-07 DIAGNOSIS — M25511 Pain in right shoulder: Secondary | ICD-10-CM | POA: Diagnosis not present

## 2018-11-07 NOTE — Progress Notes (Signed)
PROCEDURE NOTE:  The patient request injection, verbal consent was obtained.  The right shoulder was prepped appropriately after time out was performed.   Sterile technique was observed and injection of 1 cc of Depo-Medrol 40 mg with several cc's of plain xylocaine. Anesthesia was provided by ethyl chloride and a 20-gauge needle was used to inject the shoulder area. A posterior approach was used.  The injection was tolerated well.  A band aid dressing was applied.  The patient was advised to apply ice later today and tomorrow to the injection sight as needed.  I will see her in six weeks.  Electronically Signed Sanjuana Kava, MD 9/3/20209:54 AM

## 2018-12-04 DIAGNOSIS — R600 Localized edema: Secondary | ICD-10-CM | POA: Diagnosis not present

## 2018-12-04 DIAGNOSIS — E782 Mixed hyperlipidemia: Secondary | ICD-10-CM | POA: Diagnosis not present

## 2018-12-04 DIAGNOSIS — I10 Essential (primary) hypertension: Secondary | ICD-10-CM | POA: Diagnosis not present

## 2018-12-04 DIAGNOSIS — E039 Hypothyroidism, unspecified: Secondary | ICD-10-CM | POA: Diagnosis not present

## 2018-12-19 ENCOUNTER — Other Ambulatory Visit: Payer: Self-pay

## 2018-12-19 ENCOUNTER — Ambulatory Visit: Payer: PPO

## 2018-12-19 ENCOUNTER — Encounter: Payer: Self-pay | Admitting: Orthopaedic Surgery

## 2018-12-19 ENCOUNTER — Ambulatory Visit (INDEPENDENT_AMBULATORY_CARE_PROVIDER_SITE_OTHER): Payer: PPO | Admitting: Orthopaedic Surgery

## 2018-12-19 VITALS — BP 164/90 | HR 78 | Ht 61.0 in | Wt 134.0 lb

## 2018-12-19 DIAGNOSIS — M25511 Pain in right shoulder: Secondary | ICD-10-CM

## 2018-12-19 DIAGNOSIS — M25532 Pain in left wrist: Secondary | ICD-10-CM

## 2018-12-19 NOTE — Progress Notes (Signed)
Patient [id number] [name], female DOB:1929/06/19, 85 y.o. [id number]  Chief Complaint  Patient presents with  . Shoulder Pain    right/ wants injection  . Wrist Pain    left    HPI  Jeanette Brady is a 85 y.o. female who has right shoulder pain that is chronic.  She wants an injection. She has pain with overhead use.  She has pain sleeping on it. She has no new trauma.  She has no weakness.  She has pain also of the left wrist that is getting worse and worse.  It swells. She has limited motion, no trauma.   Body mass index is 25.32 kg/m.  ROS  Review of Systems  Constitutional: Positive for activity change.  Musculoskeletal: Positive for arthralgias, back pain, gait problem and joint swelling.  All other systems reviewed and are negative.   All other systems reviewed and are negative.  The following is a summary of the past history medically, past history surgically, known current medicines, social history and family history.  This information is gathered electronically by the computer from prior information and documentation.  I review this each visit and have found including this information at this point in the chart is beneficial and informative.    Past Medical History:  Diagnosis Date  . Anemia   . Arthritis   . Cataract   . Chronic kidney disease   . Heart murmur   . Hypertension   . 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 Thyroid disease     Past Surgical History:  Procedure Laterality Date  . BACK SURGERY    . EYE SURGERY    . JOINT REPLACEMENT    . REPLACEMENT TOTAL KNEE BILATERAL    . SPINE SURGERY    . YAG LASER APPLICATION Right [id number]   Procedure: YAG LASER APPLICATION;  Surgeon: Rutherford Guys, MD;  Location: [facility];  Service: Ophthalmology;  Laterality: Right;    Family History  Problem Relation Age of Onset  . Heart attack Mother   . Heart disease Mother   . Kidney disease Mother   . Kidney disease Sister        on dialysis   .  Hypertension Daughter   . Hypercholesterolemia Daughter   . Anxiety disorder Daughter   . Cancer Sister   . COPD Sister   . Heart disease Sister   . Syncope episode Neg Hx     Social History Social History   Tobacco Use  . Smoking status: Former Smoker    Packs/day: 0.05    Years: 20.00    Pack years: 1.00    Types: Cigarettes    Quit date: 03/06/1993    Years since quitting: 25.8  . Smokeless tobacco: Current User    Types: Snuff  Substance Use Topics  . Alcohol use: No  . Drug use: No    No Known Allergies  Current Outpatient Medications  Medication Sig Dispense Refill  . alendronate (FOSAMAX) 70 MG tablet Take 70 mg by mouth once a week. Take with a full glass of water on an empty stomach.    Marland Kitchen amLODipine (NORVASC) 5 MG tablet TAKE 1 TABLET EVERY DAY 90 tablet 3  . aspirin EC 81 MG tablet Take 81 mg by mouth daily.    . Cholecalciferol (VITAMIN D3) 50000 units CAPS Take 1 capsule by mouth every 30 (thirty) days. 4 capsule 3  . furosemide (LASIX) 20 MG tablet Take 1 tablet (20 mg total) by mouth daily.  90 tablet 3  . gabapentin (NEURONTIN) 300 MG capsule TAKE 1 CAPSULE BY MOUTH THREE TIMES A DAY 90 capsule 0  . ibuprofen (ADVIL,MOTRIN) 600 MG tablet Take 1 tablet (600 mg total) by mouth every 8 (eight) hours as needed. Arthritis pain 50 tablet 3  . levothyroxine (SYNTHROID, LEVOTHROID) 100 MCG tablet TAKE 1 TABLET BY MOUTH EVERY DAY 30 tablet 0  . losartan (COZAAR) 100 MG tablet TAKE 1 TABLET BY MOUTH EVERY DAY 30 tablet 0  . memantine (NAMENDA) 10 MG tablet     . traMADol-acetaminophen (ULTRACET) 37.5-325 MG tablet TAKE 1 TABLET BY MOUTH EVERY 4 HOURS AS NEEDED 50 tablet 0  . valsartan (DIOVAN) 320 MG tablet     . Cholecalciferol (VITAMIN D) 50 MCG (2000 UT) tablet Take 2,000 Units by mouth daily.     No current facility-administered medications for this visit.      Physical Exam  Blood pressure (!) 164/90, pulse 78, height 5\' 1"  (1.549 m), weight 134 lb (60.8  kg).  Constitutional: overall normal hygiene, normal nutrition, well developed, normal grooming, normal body habitus. Assistive device:none  Musculoskeletal: gait and station Limp none, muscle tone and strength are normal, no tremors or atrophy is present.  .  Neurological: coordination overall normal.  Deep tendon reflex/nerve stretch intact.  Sensation normal.  Cranial nerves II-XII intact.   Skin:   Normal overall no scars, lesions, ulcers or rashes. No psoriasis.  Psychiatric: Alert and oriented x 3.  Recent memory intact, remote memory unclear.  Normal mood and affect. Well groomed.  Good eye contact.  Cardiovascular: overall no swelling, no varicosities, no edema bilaterally, normal temperatures of the legs and arms, no clubbing, cyanosis and good capillary refill.  Lymphatic: palpation is normal.  Examination of right Upper Extremity is done.  Inspection:   Overall:  Elbow non-tender without crepitus or defects, forearm non-tender without crepitus or defects, wrist non-tender without crepitus or defects, hand non-tender.    Shoulder: with glenohumeral joint tenderness, without effusion.   Upper arm:  without swelling and tenderness   Range of motion:   Overall:  Full range of motion of the elbow, full range of motion of wrist and full range of motion in fingers.   Shoulder:  right  150 degrees forward flexion; 140 degrees abduction; 30 degrees internal rotation, 30 degrees external rotation, 10 degrees extension, 40 degrees adduction.   Stability:   Overall:  Shoulder, elbow and wrist stable   Strength and Tone:   Overall full shoulder muscles strength, full upper arm strength and normal upper arm bulk and tone.  Left wrist is very tender and has swelling.  Motion is very limited.  It has slight warmth.  NV intact.  All other systems reviewed and are negative   The patient has been educated about the nature of the problem(s) and counseled on treatment options.  The patient  appeared to understand what I have discussed and is in agreement with it.  X-rays were done of the left wrist, reported separately. She has severe degenerative changes.  Encounter Diagnoses  Name Primary?  . Pain in left wrist Yes  . Pain in joint of right shoulder    PROCEDURE NOTE:  The patient request injection, verbal consent was obtained.  The right shoulder was prepped appropriately after time out was performed.   Sterile technique was observed and injection of 1 cc of Depo-Medrol 40 mg with several cc's of plain xylocaine. Anesthesia was provided by ethyl chloride and a  20-gauge needle was used to inject the shoulder area. A posterior approach was used.  The injection was tolerated well.  A band aid dressing was applied.  The patient was advised to apply ice later today and tomorrow to the injection sight as needed.    PLAN Call if any problems.  Precautions discussed.  Continue current medications.   Return to clinic 1 month   A cock-up splint was fitted on the left wrist.   Electronically Signed Sanjuana Kava, MD 10/15/202011:24 AM

## 2018-12-25 DIAGNOSIS — R413 Other amnesia: Secondary | ICD-10-CM | POA: Diagnosis not present

## 2018-12-25 DIAGNOSIS — G3184 Mild cognitive impairment, so stated: Secondary | ICD-10-CM | POA: Diagnosis not present

## 2018-12-25 DIAGNOSIS — E441 Mild protein-calorie malnutrition: Secondary | ICD-10-CM | POA: Diagnosis not present

## 2018-12-25 DIAGNOSIS — Z0001 Encounter for general adult medical examination with abnormal findings: Secondary | ICD-10-CM | POA: Diagnosis not present

## 2018-12-25 DIAGNOSIS — M2550 Pain in unspecified joint: Secondary | ICD-10-CM | POA: Diagnosis not present

## 2018-12-25 DIAGNOSIS — M25511 Pain in right shoulder: Secondary | ICD-10-CM | POA: Diagnosis not present

## 2018-12-25 DIAGNOSIS — Z23 Encounter for immunization: Secondary | ICD-10-CM | POA: Diagnosis not present

## 2018-12-25 DIAGNOSIS — D530 Protein deficiency anemia: Secondary | ICD-10-CM | POA: Diagnosis not present

## 2018-12-25 DIAGNOSIS — G9009 Other idiopathic peripheral autonomic neuropathy: Secondary | ICD-10-CM | POA: Diagnosis not present

## 2018-12-25 DIAGNOSIS — Z712 Person consulting for explanation of examination or test findings: Secondary | ICD-10-CM | POA: Diagnosis not present

## 2018-12-25 DIAGNOSIS — Z6826 Body mass index (BMI) 26.0-26.9, adult: Secondary | ICD-10-CM | POA: Diagnosis not present

## 2018-12-25 DIAGNOSIS — E782 Mixed hyperlipidemia: Secondary | ICD-10-CM | POA: Diagnosis not present

## 2018-12-25 DIAGNOSIS — R600 Localized edema: Secondary | ICD-10-CM | POA: Diagnosis not present

## 2018-12-25 DIAGNOSIS — R944 Abnormal results of kidney function studies: Secondary | ICD-10-CM | POA: Diagnosis not present

## 2018-12-25 DIAGNOSIS — R011 Cardiac murmur, unspecified: Secondary | ICD-10-CM | POA: Diagnosis not present

## 2018-12-25 DIAGNOSIS — I10 Essential (primary) hypertension: Secondary | ICD-10-CM | POA: Diagnosis not present

## 2018-12-25 DIAGNOSIS — M25512 Pain in left shoulder: Secondary | ICD-10-CM | POA: Diagnosis not present

## 2019-01-16 ENCOUNTER — Ambulatory Visit (INDEPENDENT_AMBULATORY_CARE_PROVIDER_SITE_OTHER): Payer: PPO | Admitting: Orthopaedic Surgery

## 2019-01-16 ENCOUNTER — Encounter: Payer: Self-pay | Admitting: Orthopaedic Surgery

## 2019-01-16 ENCOUNTER — Ambulatory Visit: Payer: PPO | Admitting: Orthopaedic Surgery

## 2019-01-16 ENCOUNTER — Other Ambulatory Visit: Payer: Self-pay

## 2019-01-16 VITALS — BP 168/76 | HR 68 | Temp 96.8°F | Ht 61.0 in | Wt 134.0 lb

## 2019-01-16 DIAGNOSIS — M25532 Pain in left wrist: Secondary | ICD-10-CM | POA: Diagnosis not present

## 2019-01-16 DIAGNOSIS — G8929 Other chronic pain: Secondary | ICD-10-CM | POA: Diagnosis not present

## 2019-01-16 DIAGNOSIS — M25512 Pain in left shoulder: Secondary | ICD-10-CM

## 2019-01-16 NOTE — Progress Notes (Signed)
[name], female DOB:01/03/1930, 85 y.o. [id number]  Chief Complaint  Patient presents with  . Wrist Pain    Left wrist pain.    HPI  Jeanette Brady is a 85 y.o. female who has left wrist pain.  She could not tolerate the cock-up splint. 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 She uses a wrist sleeve that helps.  I told her to continue this and also use Aspercreme or Voltaren Gel.  I will give Rx for her walker for a platform for the left side so she will put weight on her forearm instead of the wrist.   Body mass index is 25.32 kg/m.  ROS  Review of Systems  Constitutional: Positive for activity change.  Musculoskeletal: Positive for arthralgias, back pain, gait problem and joint swelling.  All other systems reviewed and are negative.   All other systems reviewed and are negative.  The following is a summary of the past history medically, past history surgically, known current medicines, social history and family history.  This information is gathered electronically by the computer from prior information and documentation.  I review this each visit and have found including this information at this point in the chart is beneficial and informative.    Past Medical History:  Diagnosis Date  . Anemia   . Arthritis   . Cataract   . Chronic kidney disease   . Heart murmur   . Hypertension   . 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 Thyroid disease     Past Surgical History:  Procedure Laterality Date  . BACK SURGERY    . EYE SURGERY    . JOINT REPLACEMENT    . REPLACEMENT TOTAL KNEE BILATERAL    . SPINE SURGERY    . YAG LASER APPLICATION Right [id number]   Procedure: YAG LASER APPLICATION;  Surgeon: Rutherford Guys, MD;  Location: [facility];  Service: Ophthalmology;  Laterality: Right;    Family History  Problem Relation Age of Onset  . Heart attack Mother   . Heart disease Mother   . Kidney disease Mother   . Kidney disease Sister    on dialysis   . Hypertension Daughter   . Hypercholesterolemia Daughter   . Anxiety disorder Daughter   . Cancer Sister   . COPD Sister   . Heart disease Sister   . Syncope episode Neg Hx     Social History Social History   Tobacco Use  . Smoking status: Former Smoker    Packs/day: 0.05    Years: 20.00    Pack years: 1.00    Types: Cigarettes    Quit date: 03/06/1993    Years since quitting: 25.8  . Smokeless tobacco: Current User    Types: Snuff  Substance Use Topics  . Alcohol use: No  . Drug use: No    No Known Allergies  Current Outpatient Medications  Medication Sig Dispense Refill  . alendronate (FOSAMAX) 70 MG tablet Take 70 mg by mouth once a week. Take with a full glass of water on an empty stomach.    Marland Kitchen amLODipine (NORVASC) 5 MG tablet TAKE 1 TABLET EVERY DAY 90 tablet 3  . aspirin EC 81 MG tablet Take 81 mg by mouth daily.    . Cholecalciferol (VITAMIN D) 50 MCG (2000 UT) tablet Take 2,000 Units by mouth daily.    . Cholecalciferol (VITAMIN D3) 50000 units CAPS Take 1 capsule by mouth every 30 (thirty) days. 4 capsule 3  .  furosemide (LASIX) 20 MG tablet Take 1 tablet (20 mg total) by mouth daily. 90 tablet 3  . gabapentin (NEURONTIN) 300 MG capsule TAKE 1 CAPSULE BY MOUTH THREE TIMES A DAY 90 capsule 0  . ibuprofen (ADVIL,MOTRIN) 600 MG tablet Take 1 tablet (600 mg total) by mouth every 8 (eight) hours as needed. Arthritis pain 50 tablet 3  . levothyroxine (SYNTHROID, LEVOTHROID) 100 MCG tablet TAKE 1 TABLET BY MOUTH EVERY DAY 30 tablet 0  . losartan (COZAAR) 100 MG tablet TAKE 1 TABLET BY MOUTH EVERY DAY 30 tablet 0  . memantine (NAMENDA) 10 MG tablet     . traMADol-acetaminophen (ULTRACET) 37.5-325 MG tablet TAKE 1 TABLET BY MOUTH EVERY 4 HOURS AS NEEDED 50 tablet 0  . valsartan (DIOVAN) 320 MG tablet      No current facility-administered medications for this visit.      Physical Exam  Blood pressure (!) 168/76, pulse 68, temperature (!) 96.8 F (36  C), height 5\' 1"  (1.549 m), weight 134 lb (60.8 kg).  Constitutional: overall normal hygiene, normal nutrition, well developed, normal grooming, normal body habitus. Assistive device:walker  Musculoskeletal: gait and station Limp left, muscle tone and strength are normal, no tremors or atrophy is present.  .  Neurological: coordination overall normal.  Deep tendon reflex/nerve stretch intact.  Sensation normal.  Cranial nerves II-XII intact.   Skin:   Normal overall no scars, lesions, ulcers or rashes. No psoriasis.  Psychiatric: Alert and oriented x 3.  Recent memory intact, remote memory unclear.  Normal mood and affect. Well groomed.  Good eye contact.  Cardiovascular: overall no swelling, no varicosities, no edema bilaterally, normal temperatures of the legs and arms, no clubbing, cyanosis and good capillary refill.  Lymphatic: palpation is normal.  Left wrist has some swelling, markedly decreased motion, slight deformity, NV intact.  Grips fair.  All other systems reviewed and are negative   The patient has been educated about the nature of the problem(s) and counseled on treatment options.  The patient appeared to understand what I have discussed and is in agreement with it.  Encounter Diagnoses  Name Primary?  . Pain in left wrist Yes  . Chronic left shoulder pain     PLAN Call if any problems.  Precautions discussed.  Continue current medications.   Return to clinic 2 months   Electronically [place], MD 11/12/20209:36 AM

## 2019-01-23 ENCOUNTER — Ambulatory Visit: Payer: PPO | Admitting: Orthopaedic Surgery

## 2019-02-05 DIAGNOSIS — G3184 Mild cognitive impairment, so stated: Secondary | ICD-10-CM | POA: Diagnosis not present

## 2019-02-05 DIAGNOSIS — R600 Localized edema: Secondary | ICD-10-CM | POA: Diagnosis not present

## 2019-02-05 DIAGNOSIS — M25512 Pain in left shoulder: Secondary | ICD-10-CM | POA: Diagnosis not present

## 2019-02-05 DIAGNOSIS — Z23 Encounter for immunization: Secondary | ICD-10-CM | POA: Diagnosis not present

## 2019-02-05 DIAGNOSIS — R944 Abnormal results of kidney function studies: Secondary | ICD-10-CM | POA: Diagnosis not present

## 2019-02-05 DIAGNOSIS — E7849 Other hyperlipidemia: Secondary | ICD-10-CM | POA: Diagnosis not present

## 2019-02-05 DIAGNOSIS — M25511 Pain in right shoulder: Secondary | ICD-10-CM | POA: Diagnosis not present

## 2019-02-05 DIAGNOSIS — I10 Essential (primary) hypertension: Secondary | ICD-10-CM | POA: Diagnosis not present

## 2019-02-05 DIAGNOSIS — G9009 Other idiopathic peripheral autonomic neuropathy: Secondary | ICD-10-CM | POA: Diagnosis not present

## 2019-02-05 DIAGNOSIS — R011 Cardiac murmur, unspecified: Secondary | ICD-10-CM | POA: Diagnosis not present

## 2019-02-05 DIAGNOSIS — M179 Osteoarthritis of knee, unspecified: Secondary | ICD-10-CM | POA: Diagnosis not present

## 2019-03-18 ENCOUNTER — Encounter: Payer: Self-pay | Admitting: Orthopaedic Surgery

## 2019-03-18 ENCOUNTER — Ambulatory Visit (INDEPENDENT_AMBULATORY_CARE_PROVIDER_SITE_OTHER): Payer: PPO | Admitting: Orthopaedic Surgery

## 2019-03-18 ENCOUNTER — Other Ambulatory Visit: Payer: Self-pay

## 2019-03-18 VITALS — BP 124/76 | HR 76 | Temp 96.9°F | Ht 61.0 in | Wt 139.4 lb

## 2019-03-18 DIAGNOSIS — M25532 Pain in left wrist: Secondary | ICD-10-CM

## 2019-03-18 DIAGNOSIS — M25512 Pain in left shoulder: Secondary | ICD-10-CM

## 2019-03-18 DIAGNOSIS — G8929 Other chronic pain: Secondary | ICD-10-CM | POA: Diagnosis not present

## 2019-03-18 NOTE — Progress Notes (Signed)
PROCEDURE NOTE:  The patient request injection, verbal consent was obtained.  The left shoulder was prepped appropriately after time out was performed.   Sterile technique was observed and injection of 1 cc of Depo-Medrol 40 mg with several cc's of plain xylocaine. Anesthesia was provided by ethyl chloride and a 20-gauge needle was used to inject the shoulder area. A posterior approach was used.  The injection was tolerated well.  A band aid dressing was applied.  The patient was advised to apply ice later today and tomorrow to the injection sight as needed.  Return in two months.  She did not get the platform for the walker as she said it was too expensive.  They will call and see how much of it Medicare will pay, if any.  Call if any problem.  Precautions discussed.   Electronically Signed Sanjuana Kava, MD 1/12/20211:36 PM

## 2019-03-31 DIAGNOSIS — Z23 Encounter for immunization: Secondary | ICD-10-CM | POA: Diagnosis not present

## 2019-03-31 DIAGNOSIS — E441 Mild protein-calorie malnutrition: Secondary | ICD-10-CM | POA: Diagnosis not present

## 2019-03-31 DIAGNOSIS — Z0001 Encounter for general adult medical examination with abnormal findings: Secondary | ICD-10-CM | POA: Diagnosis not present

## 2019-03-31 DIAGNOSIS — M2550 Pain in unspecified joint: Secondary | ICD-10-CM | POA: Diagnosis not present

## 2019-03-31 DIAGNOSIS — R413 Other amnesia: Secondary | ICD-10-CM | POA: Diagnosis not present

## 2019-03-31 DIAGNOSIS — D530 Protein deficiency anemia: Secondary | ICD-10-CM | POA: Diagnosis not present

## 2019-03-31 DIAGNOSIS — Z6826 Body mass index (BMI) 26.0-26.9, adult: Secondary | ICD-10-CM | POA: Diagnosis not present

## 2019-03-31 DIAGNOSIS — E782 Mixed hyperlipidemia: Secondary | ICD-10-CM | POA: Diagnosis not present

## 2019-03-31 DIAGNOSIS — G9009 Other idiopathic peripheral autonomic neuropathy: Secondary | ICD-10-CM | POA: Diagnosis not present

## 2019-03-31 DIAGNOSIS — R011 Cardiac murmur, unspecified: Secondary | ICD-10-CM | POA: Diagnosis not present

## 2019-03-31 DIAGNOSIS — Z712 Person consulting for explanation of examination or test findings: Secondary | ICD-10-CM | POA: Diagnosis not present

## 2019-03-31 DIAGNOSIS — R944 Abnormal results of kidney function studies: Secondary | ICD-10-CM | POA: Diagnosis not present

## 2019-04-02 DIAGNOSIS — G9009 Other idiopathic peripheral autonomic neuropathy: Secondary | ICD-10-CM | POA: Diagnosis not present

## 2019-04-02 DIAGNOSIS — E7849 Other hyperlipidemia: Secondary | ICD-10-CM | POA: Diagnosis not present

## 2019-04-02 DIAGNOSIS — R011 Cardiac murmur, unspecified: Secondary | ICD-10-CM | POA: Diagnosis not present

## 2019-04-02 DIAGNOSIS — R600 Localized edema: Secondary | ICD-10-CM | POA: Diagnosis not present

## 2019-04-02 DIAGNOSIS — E039 Hypothyroidism, unspecified: Secondary | ICD-10-CM | POA: Diagnosis not present

## 2019-04-02 DIAGNOSIS — R944 Abnormal results of kidney function studies: Secondary | ICD-10-CM | POA: Diagnosis not present

## 2019-04-02 DIAGNOSIS — I10 Essential (primary) hypertension: Secondary | ICD-10-CM | POA: Diagnosis not present

## 2019-04-02 DIAGNOSIS — E441 Mild protein-calorie malnutrition: Secondary | ICD-10-CM | POA: Diagnosis not present

## 2019-04-02 DIAGNOSIS — G3184 Mild cognitive impairment, so stated: Secondary | ICD-10-CM | POA: Diagnosis not present

## 2019-04-03 DIAGNOSIS — M25512 Pain in left shoulder: Secondary | ICD-10-CM | POA: Diagnosis not present

## 2019-04-03 DIAGNOSIS — R011 Cardiac murmur, unspecified: Secondary | ICD-10-CM | POA: Diagnosis not present

## 2019-04-03 DIAGNOSIS — E441 Mild protein-calorie malnutrition: Secondary | ICD-10-CM | POA: Diagnosis not present

## 2019-04-03 DIAGNOSIS — D530 Protein deficiency anemia: Secondary | ICD-10-CM | POA: Diagnosis not present

## 2019-04-03 DIAGNOSIS — E782 Mixed hyperlipidemia: Secondary | ICD-10-CM | POA: Diagnosis not present

## 2019-04-03 DIAGNOSIS — G3184 Mild cognitive impairment, so stated: Secondary | ICD-10-CM | POA: Diagnosis not present

## 2019-04-03 DIAGNOSIS — M25511 Pain in right shoulder: Secondary | ICD-10-CM | POA: Diagnosis not present

## 2019-04-03 DIAGNOSIS — R600 Localized edema: Secondary | ICD-10-CM | POA: Diagnosis not present

## 2019-04-03 DIAGNOSIS — G9009 Other idiopathic peripheral autonomic neuropathy: Secondary | ICD-10-CM | POA: Diagnosis not present

## 2019-04-03 DIAGNOSIS — I10 Essential (primary) hypertension: Secondary | ICD-10-CM | POA: Diagnosis not present

## 2019-04-03 DIAGNOSIS — M65321 Trigger finger, right index finger: Secondary | ICD-10-CM | POA: Diagnosis not present

## 2019-04-03 DIAGNOSIS — E039 Hypothyroidism, unspecified: Secondary | ICD-10-CM | POA: Diagnosis not present

## 2019-04-17 DIAGNOSIS — E7849 Other hyperlipidemia: Secondary | ICD-10-CM | POA: Diagnosis not present

## 2019-04-17 DIAGNOSIS — E782 Mixed hyperlipidemia: Secondary | ICD-10-CM | POA: Diagnosis not present

## 2019-04-17 DIAGNOSIS — R600 Localized edema: Secondary | ICD-10-CM | POA: Diagnosis not present

## 2019-04-17 DIAGNOSIS — E039 Hypothyroidism, unspecified: Secondary | ICD-10-CM | POA: Diagnosis not present

## 2019-04-17 DIAGNOSIS — I10 Essential (primary) hypertension: Secondary | ICD-10-CM | POA: Diagnosis not present

## 2019-05-05 DIAGNOSIS — R600 Localized edema: Secondary | ICD-10-CM | POA: Diagnosis not present

## 2019-05-05 DIAGNOSIS — E782 Mixed hyperlipidemia: Secondary | ICD-10-CM | POA: Diagnosis not present

## 2019-05-05 DIAGNOSIS — E039 Hypothyroidism, unspecified: Secondary | ICD-10-CM | POA: Diagnosis not present

## 2019-05-05 DIAGNOSIS — I10 Essential (primary) hypertension: Secondary | ICD-10-CM | POA: Diagnosis not present

## 2019-05-20 ENCOUNTER — Other Ambulatory Visit: Payer: Self-pay

## 2019-05-20 ENCOUNTER — Encounter: Payer: Self-pay | Admitting: Orthopaedic Surgery

## 2019-05-20 ENCOUNTER — Ambulatory Visit: Payer: PPO | Admitting: Orthopaedic Surgery

## 2019-05-20 VITALS — BP 196/86 | HR 94 | Ht 61.0 in | Wt 139.0 lb

## 2019-05-20 DIAGNOSIS — M25512 Pain in left shoulder: Secondary | ICD-10-CM

## 2019-05-20 DIAGNOSIS — G8929 Other chronic pain: Secondary | ICD-10-CM

## 2019-05-20 NOTE — Progress Notes (Signed)
My shoulder does not hurt  She has full motion and no pain of the left shoulder.  She has no new trauma.  NV intact.  Encounter Diagnosis  Name Primary?  . Chronic left shoulder pain Yes   Discharge.  Call if any problem.  Precautions discussed.   Electronically Signed Sanjuana Kava, MD 3/16/20211:49 PM

## 2019-06-03 ENCOUNTER — Emergency Department (HOSPITAL_COMMUNITY)
Admission: EM | Admit: 2019-06-03 | Discharge: 2019-06-03 | Disposition: A | Discharge status: Home / Self Care | Payer: PPO | Attending: Emergency Medicine | Admitting: Emergency Medicine

## 2019-06-03 ENCOUNTER — Other Ambulatory Visit: Payer: Self-pay

## 2019-06-03 ENCOUNTER — Emergency Department (HOSPITAL_COMMUNITY): Payer: PPO

## 2019-06-03 ENCOUNTER — Encounter (HOSPITAL_COMMUNITY): Payer: Self-pay | Admitting: *Deleted

## 2019-06-03 DIAGNOSIS — E039 Hypothyroidism, unspecified: Secondary | ICD-10-CM | POA: Insufficient documentation

## 2019-06-03 DIAGNOSIS — Z79899 Other long term (current) drug therapy: Secondary | ICD-10-CM | POA: Diagnosis not present

## 2019-06-03 DIAGNOSIS — I10 Essential (primary) hypertension: Secondary | ICD-10-CM

## 2019-06-03 DIAGNOSIS — N3000 Acute cystitis without hematuria: Secondary | ICD-10-CM

## 2019-06-03 DIAGNOSIS — I129 Hypertensive chronic kidney disease with stage 1 through stage 4 chronic kidney disease, or unspecified chronic kidney disease: Secondary | ICD-10-CM | POA: Diagnosis not present

## 2019-06-03 DIAGNOSIS — Z87891 Personal history of nicotine dependence: Secondary | ICD-10-CM | POA: Diagnosis not present

## 2019-06-03 DIAGNOSIS — R55 Syncope and collapse: Secondary | ICD-10-CM | POA: Diagnosis not present

## 2019-06-03 DIAGNOSIS — N189 Chronic kidney disease, unspecified: Secondary | ICD-10-CM | POA: Insufficient documentation

## 2019-06-03 DIAGNOSIS — Z96653 Presence of artificial knee joint, bilateral: Secondary | ICD-10-CM | POA: Insufficient documentation

## 2019-06-03 DIAGNOSIS — Z7982 Long term (current) use of aspirin: Secondary | ICD-10-CM | POA: Insufficient documentation

## 2019-06-03 LAB — BASIC METABOLIC PANEL
Anion gap: 9 (ref 5–15)
BUN: 13 mg/dL (ref 8–23)
CO2: 23 mmol/L (ref 22–32)
Calcium: 9.4 mg/dL (ref 8.9–10.3)
Chloride: 107 mmol/L (ref 98–111)
Creatinine, Ser: 1.08 mg/dL — ABNORMAL HIGH (ref 0.44–1.00)
GFR calc Af Amer: 53 mL/min — ABNORMAL LOW (ref >60)
GFR calc non Af Amer: 46 mL/min — ABNORMAL LOW (ref >60)
Glucose, Bld: 112 mg/dL — ABNORMAL HIGH (ref 70–99)
Potassium: 3.9 mmol/L (ref 3.5–5.1)
Sodium: 139 mmol/L (ref 135–145)

## 2019-06-03 LAB — CBC WITH DIFFERENTIAL/PLATELET
Abs Immature Granulocytes: 0.02 10*3/uL (ref 0.00–0.07)
Basophils Absolute: 0.1 10*3/uL (ref 0.0–0.1)
Basophils Relative: 1 %
Eosinophils Absolute: 0.2 10*3/uL (ref 0.0–0.5)
Eosinophils Relative: 2 %
HCT: 36 % (ref 36.0–46.0)
Hemoglobin: 11.3 g/dL — ABNORMAL LOW (ref 12.0–15.0)
Immature Granulocytes: 0 %
Lymphocytes Relative: 21 %
Lymphs Abs: 1.5 10*3/uL (ref 0.7–4.0)
MCH: 29.4 pg (ref 26.0–34.0)
MCHC: 31.4 g/dL (ref 30.0–36.0)
MCV: 93.5 fL (ref 80.0–100.0)
Monocytes Absolute: 0.7 10*3/uL (ref 0.1–1.0)
Monocytes Relative: 11 %
Neutro Abs: 4.5 10*3/uL (ref 1.7–7.7)
Neutrophils Relative %: 65 %
Platelets: 307 10*3/uL (ref 150–400)
RBC: 3.85 MIL/uL — ABNORMAL LOW (ref 3.87–5.11)
RDW: 13.8 % (ref 11.5–15.5)
WBC: 6.9 10*3/uL (ref 4.0–10.5)
nRBC: 0 % (ref 0.0–0.2)

## 2019-06-03 LAB — CBG MONITORING, ED: Glucose-Capillary: 93 mg/dL (ref 70–99)

## 2019-06-03 LAB — URINALYSIS, ROUTINE W REFLEX MICROSCOPIC
Bilirubin Urine: NEGATIVE
Glucose, UA: NEGATIVE mg/dL
Hgb urine dipstick: NEGATIVE
Ketones, ur: NEGATIVE mg/dL
Nitrite: NEGATIVE
Protein, ur: NEGATIVE mg/dL
Specific Gravity, Urine: 1.02 (ref 1.005–1.030)
pH: 6 (ref 5.0–8.0)

## 2019-06-03 LAB — TROPONIN I (HIGH SENSITIVITY): Troponin I (High Sensitivity): 8 ng/L (ref <18)

## 2019-06-03 MED ORDER — CEPHALEXIN 500 MG PO CAPS: 500.0000 mg | ORAL_CAPSULE | Freq: Four times a day (QID) | ORAL | 0 refills | Status: AC

## 2019-06-03 NOTE — ED Triage Notes (Signed)
Pt states she passed out while on couch this morning.  Pt lives home alone.  She believes she was out for about 15 min. Pt c/o left wrist pain and swelling.

## 2019-06-03 NOTE — Discharge Instructions (Signed)
Your blood pressure this evening is elevated.  It is important that you take your blood pressure medication daily as directed.  Take the antibiotic as directed until its finished.  Call Dr. Juel Burrow office to arrange a follow-up appointment this week.  Return to emergency room for any worsening symptoms.

## 2019-06-03 NOTE — ED Notes (Signed)
Pt was informed that we need a urine sample. 

## 2019-06-03 NOTE — ED Provider Notes (Signed)
[facility] EMERGENCY DEPARTMENT Provider Note   CSN: [id number] Arrival date & time: 06/03/19  1541     History Chief Complaint  Patient presents with  . Loss of Consciousness    JOHNICA RASER is a 86 y.o. female.  HPI      MKAYLA PATIERNO is a 86 y.o. female who presents to the Emergency Department requesting evaluation after a possible syncopal event that occurred earlier today.  Patient states that she was sitting on the couch and "blacked out" patient states that she was out for approximately 15 minutes.  Patient lives at home alone, so event was unwitnessed.  She denies any preceding symptoms.  She does complain of pain to her left wrist with a focal area of swelling.  This has been present for several weeks to months.  She denies fall or other trauma.  She denies any symptoms at present, but states that her daughter brought her in for evaluation.  She denies any chest pain, shortness of breath, headache or dizziness, visual changes, numbness or weakness.  No dysuria, fever, or chills.    Past Medical History:  Diagnosis Date  . Anemia   . Arthritis   . Cataract   . Chronic kidney disease   . Heart murmur   . Hypertension   . 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 Thyroid disease     Patient Active Problem List   Diagnosis Date Noted  . Intention tremor 05/01/2016  . 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 Primary osteoarthritis involving multiple joints 01/25/2016  . Personal history of benign brain tumor 01/25/2016  . Syncope 10/15/2015  . Hypertension 10/15/2015  . Hypothyroidism 10/15/2015  . Heart murmur 10/15/2015  . Kidney disease 10/15/2015  . Normocytic anemia 10/15/2015  . SHOULDER PAIN 06/09/2008    Past Surgical History:  Procedure Laterality Date  . BACK SURGERY    . EYE SURGERY    . JOINT REPLACEMENT    . REPLACEMENT TOTAL KNEE BILATERAL    . SPINE SURGERY    . YAG LASER APPLICATION Right [id number]   Procedure: YAG LASER APPLICATION;   Surgeon: Rutherford Guys, MD;  Location: [facility];  Service: Ophthalmology;  Laterality: Right;     OB History    Gravida  2   Para  2   Term  2   Preterm      AB      Living  2     SAB      TAB      Ectopic      Multiple      Live Births              Family History  Problem Relation Age of Onset  . Heart attack Mother   . Heart disease Mother   . Kidney disease Mother   . Kidney disease Sister        on dialysis   . Hypertension Daughter   . Hypercholesterolemia Daughter   . Anxiety disorder Daughter   . Cancer Sister   . COPD Sister   . Heart disease Sister   . Syncope episode Neg Hx     Social History   Tobacco Use  . Smoking status: Former Smoker    Packs/day: 0.05    Years: 20.00    Pack years: 1.00    Types: Cigarettes    Quit date: 03/06/1993    Years since quitting: 26.2  . Smokeless tobacco: Current User    Types: Snuff  Substance Use Topics  . Alcohol use: No  . Drug use: No    Home Medications Prior to Admission medications   Medication Sig Start Date End Date Taking? Authorizing Provider  alendronate (FOSAMAX) 70 MG tablet Take 70 mg by mouth once a week. Take with a full glass of water on an empty stomach.    [provider]  amLODipine (NORVASC) 5 MG tablet TAKE 1 TABLET EVERY DAY 01/29/17   Raylene Everts, MD  aspirin EC 81 MG tablet Take 81 mg by mouth daily.    [provider]  Cholecalciferol (VITAMIN D) 50 MCG (2000 UT) tablet Take 2,000 Units by mouth daily. 12/09/18   [provider]  Cholecalciferol (VITAMIN D3) 50000 units CAPS Take 1 capsule by mouth every 30 (thirty) days. 05/11/17   Raylene Everts, MD  furosemide (LASIX) 20 MG tablet Take 1 tablet (20 mg total) by mouth daily. 10/30/16   Raylene Everts, MD  gabapentin (NEURONTIN) 300 MG capsule TAKE 1 CAPSULE BY MOUTH THREE TIMES A DAY Patient not taking: Reported on 05/20/2019 06/15/17   Raylene Everts, MD  ibuprofen (ADVIL,MOTRIN) 600  MG tablet Take 1 tablet (600 mg total) by mouth every 8 (eight) hours as needed. Arthritis pain 05/11/17   Raylene Everts, MD  levothyroxine (SYNTHROID, LEVOTHROID) 100 MCG tablet TAKE 1 TABLET BY MOUTH EVERY DAY 06/21/17   Raylene Everts, MD  losartan (COZAAR) 100 MG tablet TAKE 1 TABLET BY MOUTH EVERY DAY 06/21/17   Raylene Everts, MD  memantine [facility]) 10 MG tablet  06/03/18   [provider]  traMADol-acetaminophen (ULTRACET) 37.5-325 MG tablet TAKE 1 TABLET BY MOUTH EVERY 4 HOURS AS NEEDED 06/13/16   Raylene Everts, MD  valsartan (DIOVAN) 320 MG tablet  06/03/18   [provider]    Allergies    Patient has no known allergies.  Review of Systems   Review of Systems  Constitutional: Negative for appetite change, chills, fatigue and fever.  HENT: Negative for trouble swallowing.   Respiratory: Negative for shortness of breath.   Cardiovascular: Negative for chest pain, palpitations and leg swelling.  Gastrointestinal: Negative for abdominal pain, diarrhea, nausea and vomiting.  Genitourinary: Negative for dysuria, flank pain and hematuria.  Musculoskeletal: Negative for arthralgias, back pain, myalgias, neck pain and neck stiffness.  Skin: Negative for rash.  Neurological: Positive for syncope. Negative for dizziness, facial asymmetry, speech difficulty, weakness, numbness and headaches.  Hematological: Does not bruise/bleed easily.    Physical Exam Updated Vital Signs BP (!) 152/63 (BP Location: Left Arm)   Pulse 84   Temp 98.8 F (37.1 C) (Oral)   Resp 14   Ht 5\' 2"  (1.575 m)   Wt 58.5 kg   SpO2 99%   BMI 23.59 kg/m   Physical Exam Vitals reviewed.  Constitutional:      Appearance: Normal appearance. She is not ill-appearing or toxic-appearing.     Comments: Pt is elderly and frail appearing, non-toxic  HENT:     Mouth/Throat:     Mouth: Mucous membranes are moist.     Pharynx: Oropharynx is clear.  Eyes:     Extraocular Movements:  Extraocular movements intact.     Conjunctiva/sclera: Conjunctivae normal.     Pupils: Pupils are equal, round, and reactive to light.  Cardiovascular:     Rate and Rhythm: Normal rate and regular rhythm.     Pulses: Normal pulses.  Pulmonary:     Effort: Pulmonary  effort is normal. No respiratory distress.     Breath sounds: Normal breath sounds.  Chest:     Chest wall: No tenderness.  Abdominal:     General: There is no distension.     Palpations: Abdomen is soft.     Tenderness: There is no abdominal tenderness. There is no left CVA tenderness or guarding.  Musculoskeletal:        General: Normal range of motion.     Cervical back: Normal range of motion. No tenderness.     Right lower leg: No edema.     Left lower leg: No edema.  Lymphadenopathy:     Cervical: No cervical adenopathy.  Skin:    General: Skin is warm.     Capillary Refill: Capillary refill takes less than 2 seconds.     Findings: No erythema or rash.  Neurological:     Mental Status: She is alert.     GCS: GCS eye subscore is 4. GCS verbal subscore is 5. GCS motor subscore is 6.     Sensory: Sensation is intact.     Motor: Motor function is intact. No pronator drift.     Coordination: Coordination is intact.     Comments: CN II-XII intact.  Speech clear.  No pronator drift.       ED Results / Procedures / Treatments   Labs (all labs ordered are listed, but only abnormal results are displayed) Labs Reviewed  URINE CULTURE - Abnormal; Notable for the following components:      Result Value   Culture MULTIPLE SPECIES PRESENT, SUGGEST RECOLLECTION (*)    All other components within normal limits  BASIC METABOLIC PANEL - Abnormal; Notable for the following components:   Glucose, Bld 112 (*)    Creatinine, Ser 1.08 (*)    GFR calc non Af Amer 46 (*)    GFR calc Af Amer 53 (*)    All other components within normal limits  CBC WITH DIFFERENTIAL/PLATELET - Abnormal; Notable for the following components:    RBC 3.85 (*)    Hemoglobin 11.3 (*)    All other components within normal limits  URINALYSIS, ROUTINE W REFLEX MICROSCOPIC - Abnormal; Notable for the following components:   Leukocytes,Ua TRACE (*)    Bacteria, UA RARE (*)    All other components within normal limits  CBG MONITORING, ED  TROPONIN I (HIGH SENSITIVITY)    EKG EKG Interpretation  Date/Time:  Tuesday June 03 2019 16:18:50 EDT Ventricular Rate:  84 PR Interval:  140 QRS Duration: 76 QT Interval:  376 QTC Calculation: 444 R Axis:   -29 Text Interpretation: Normal sinus rhythm Minimal voltage criteria for LVH, may be normal variant ( Cornell product ) Septal infarct , age undetermined Abnormal ECG Confirmed by Virgel Manifold [phone]) on 06/03/2019 5:50:38 PM   Radiology CT HEAD WO CONTRAST  Result Date: 06/03/2019 CLINICAL DATA:  Ataxia, stroke suspected Patient reports syncope today while on the couch. EXAM: CT HEAD WITHOUT CONTRAST TECHNIQUE: Contiguous axial images were obtained from the base of the skull through the vertex without intravenous contrast. COMPARISON:  None. FINDINGS: Brain: Calcified extra-axial density left temporal region measures 3.3 x 2.7 x 2.6 cm with smooth margins. There is no surrounding mass effect or edema. Age related atrophy with mild chronic small vessel ischemia. No hydrocephalus, the basilar cisterns are patent. No hemorrhage. No evidence of acute ischemia. Vascular: Atherosclerosis of skullbase vasculature without hyperdense vessel or abnormal calcification. Skull: Densely calcified 3.2 x 2.7  x 2.6 cm lesion abuts or arises from the inner table of the left temporal bone in the middle cranial fossa anterior to the mastoid air cells. No fracture. Sinuses/Orbits: Paranasal sinuses and mastoid air cells are clear. The visualized orbits are unremarkable. Bilateral cataract resection. Other: None. IMPRESSION: 1. No acute intracranial abnormality. 2. Calcified extra-axial lesion in the left temporal  region measuring 3.3 x 2.7 x 2.6 cm, either abuts or arises from the left temporal bone anterior to the mastoid air cells. Favor densely calcified meningioma, however MRI would be confirmatory. No surrounding edema. 3. Age related atrophy and chronic small vessel ischemia. Electronically Signed   By: Keith Rake M.D.   On: 06/03/2019 17:07   DG Chest Port 1 View  Result Date: 06/03/2019 CLINICAL DATA:  Syncopal episode this morning. EXAM: PORTABLE CHEST 1 VIEW COMPARISON:  10/16/2015. FINDINGS: Interval borderline enlarged cardiac silhouette. Tortuous and calcified thoracic aorta. Mild chronic pleural thickening involving the minor fissure. Stable mild scarring at the medial left lung base. Normal vascularity. Thoracolumbar spine degenerative changes and mild scoliosis. Bilateral shoulder degenerative changes with superior migration of the humeral heads, compatible with large chronic bilateral rotator cuff tears. IMPRESSION: No acute abnormality. Electronically Signed   By: Claudie Revering M.D.   On: 06/03/2019 17:06     Procedures Procedures (including critical care time)  Medications Ordered in ED Medications - No data to display  ED Course  I have reviewed the triage vital signs and the nursing notes.  Pertinent labs & imaging results that were available during my care of the patient were reviewed by me and considered in my medical decision making (see chart for details).    MDM Rules/Calculators/A&P                      Pt with reported syncopal episode at home.  Episode unwitnessed.  No trauma.  On arrival, pt is alert, mentating well and no focal neuro deficits.  She does not appear post ictal.  Will obtain labs, CT head and EKG  Pt aslo seen by Dr. Wilson Singer.  Care plan discussed.    Possible early UTI, remaining work up reassuring.  Doubt emergent neurological process or ACS.  Pt well appearing and I was notified by nursing that pt is requesting d/c home.  Her daughter is now at  bedside to take her mother home. She declines to have second troponin drawn.  Hypertensive here, endorses taking her daily antihypertensive medication. Will tx for presumptive UTI and urine culture pending.  Return precautions discussed.    Final Clinical Impression(s) / ED Diagnoses Final diagnoses:  Syncope, unspecified syncope type  Hypertension, unspecified type  Acute cystitis without hematuria    Rx / DC Orders ED Discharge Orders    None       Bufford Lope 06/05/19 1748    Virgel Manifold, MD 06/05/19 1751

## 2019-06-05 LAB — URINE CULTURE

## 2019-06-13 DIAGNOSIS — R1010 Upper abdominal pain, unspecified: Secondary | ICD-10-CM | POA: Diagnosis not present

## 2019-06-13 DIAGNOSIS — E441 Mild protein-calorie malnutrition: Secondary | ICD-10-CM | POA: Diagnosis not present

## 2019-06-13 DIAGNOSIS — R112 Nausea with vomiting, unspecified: Secondary | ICD-10-CM | POA: Diagnosis not present

## 2019-06-13 DIAGNOSIS — I10 Essential (primary) hypertension: Secondary | ICD-10-CM | POA: Diagnosis not present

## 2019-06-13 DIAGNOSIS — I129 Hypertensive chronic kidney disease with stage 1 through stage 4 chronic kidney disease, or unspecified chronic kidney disease: Secondary | ICD-10-CM | POA: Diagnosis not present

## 2019-06-13 DIAGNOSIS — R55 Syncope and collapse: Secondary | ICD-10-CM | POA: Diagnosis not present

## 2019-06-13 DIAGNOSIS — E7849 Other hyperlipidemia: Secondary | ICD-10-CM | POA: Diagnosis not present

## 2019-06-13 DIAGNOSIS — E782 Mixed hyperlipidemia: Secondary | ICD-10-CM | POA: Diagnosis not present

## 2019-06-13 DIAGNOSIS — E039 Hypothyroidism, unspecified: Secondary | ICD-10-CM | POA: Diagnosis not present

## 2019-06-13 DIAGNOSIS — N390 Urinary tract infection, site not specified: Secondary | ICD-10-CM | POA: Diagnosis not present

## 2019-06-13 DIAGNOSIS — M2550 Pain in unspecified joint: Secondary | ICD-10-CM | POA: Diagnosis not present

## 2019-06-13 DIAGNOSIS — G3184 Mild cognitive impairment, so stated: Secondary | ICD-10-CM | POA: Diagnosis not present

## 2019-06-13 DIAGNOSIS — D649 Anemia, unspecified: Secondary | ICD-10-CM | POA: Diagnosis not present

## 2019-06-13 DIAGNOSIS — K5900 Constipation, unspecified: Secondary | ICD-10-CM | POA: Diagnosis not present

## 2019-06-13 DIAGNOSIS — D530 Protein deficiency anemia: Secondary | ICD-10-CM | POA: Diagnosis not present

## 2019-06-13 DIAGNOSIS — M179 Osteoarthritis of knee, unspecified: Secondary | ICD-10-CM | POA: Diagnosis not present

## 2019-06-13 DIAGNOSIS — G9009 Other idiopathic peripheral autonomic neuropathy: Secondary | ICD-10-CM | POA: Diagnosis not present

## 2019-06-18 ENCOUNTER — Other Ambulatory Visit: Payer: Self-pay | Admitting: Internal Medicine

## 2019-06-18 ENCOUNTER — Other Ambulatory Visit (HOSPITAL_COMMUNITY): Payer: Self-pay | Admitting: Internal Medicine

## 2019-06-18 DIAGNOSIS — R1010 Upper abdominal pain, unspecified: Secondary | ICD-10-CM

## 2019-06-20 ENCOUNTER — Encounter (HOSPITAL_COMMUNITY): Payer: Self-pay | Admitting: *Deleted

## 2019-06-20 ENCOUNTER — Other Ambulatory Visit: Payer: Self-pay | Admitting: Internal Medicine

## 2019-06-20 ENCOUNTER — Other Ambulatory Visit: Payer: Self-pay

## 2019-06-20 ENCOUNTER — Emergency Department (HOSPITAL_COMMUNITY)
Admission: EM | Admit: 2019-06-20 | Discharge: 2019-06-20 | Disposition: A | Discharge status: Home / Self Care | Payer: PPO | Attending: Emergency Medicine | Admitting: Emergency Medicine

## 2019-06-20 ENCOUNTER — Other Ambulatory Visit (HOSPITAL_COMMUNITY): Payer: Self-pay | Admitting: Internal Medicine

## 2019-06-20 ENCOUNTER — Emergency Department (HOSPITAL_COMMUNITY): Payer: PPO

## 2019-06-20 DIAGNOSIS — Z79899 Other long term (current) drug therapy: Secondary | ICD-10-CM | POA: Insufficient documentation

## 2019-06-20 DIAGNOSIS — C482 Malignant neoplasm of peritoneum, unspecified: Secondary | ICD-10-CM | POA: Insufficient documentation

## 2019-06-20 DIAGNOSIS — Z0001 Encounter for general adult medical examination with abnormal findings: Secondary | ICD-10-CM | POA: Diagnosis not present

## 2019-06-20 DIAGNOSIS — R55 Syncope and collapse: Secondary | ICD-10-CM | POA: Diagnosis not present

## 2019-06-20 DIAGNOSIS — C786 Secondary malignant neoplasm of retroperitoneum and peritoneum: Secondary | ICD-10-CM | POA: Diagnosis not present

## 2019-06-20 DIAGNOSIS — C801 Malignant (primary) neoplasm, unspecified: Secondary | ICD-10-CM | POA: Diagnosis not present

## 2019-06-20 DIAGNOSIS — N189 Chronic kidney disease, unspecified: Secondary | ICD-10-CM | POA: Diagnosis not present

## 2019-06-20 DIAGNOSIS — I129 Hypertensive chronic kidney disease with stage 1 through stage 4 chronic kidney disease, or unspecified chronic kidney disease: Secondary | ICD-10-CM | POA: Diagnosis not present

## 2019-06-20 DIAGNOSIS — R112 Nausea with vomiting, unspecified: Secondary | ICD-10-CM | POA: Insufficient documentation

## 2019-06-20 DIAGNOSIS — C799 Secondary malignant neoplasm of unspecified site: Secondary | ICD-10-CM

## 2019-06-20 DIAGNOSIS — R1032 Left lower quadrant pain: Secondary | ICD-10-CM | POA: Diagnosis present

## 2019-06-20 DIAGNOSIS — R109 Unspecified abdominal pain: Secondary | ICD-10-CM | POA: Diagnosis not present

## 2019-06-20 DIAGNOSIS — R1010 Upper abdominal pain, unspecified: Secondary | ICD-10-CM | POA: Diagnosis not present

## 2019-06-20 DIAGNOSIS — N390 Urinary tract infection, site not specified: Secondary | ICD-10-CM | POA: Diagnosis not present

## 2019-06-20 LAB — CBC WITH DIFFERENTIAL/PLATELET
Abs Immature Granulocytes: 0.01 10*3/uL (ref 0.00–0.07)
Basophils Absolute: 0.1 10*3/uL (ref 0.0–0.1)
Basophils Relative: 1 %
Eosinophils Absolute: 0.1 10*3/uL (ref 0.0–0.5)
Eosinophils Relative: 1 %
HCT: 34.9 % — ABNORMAL LOW (ref 36.0–46.0)
Hemoglobin: 11 g/dL — ABNORMAL LOW (ref 12.0–15.0)
Immature Granulocytes: 0 %
Lymphocytes Relative: 16 %
Lymphs Abs: 1.1 10*3/uL (ref 0.7–4.0)
MCH: 29.1 pg (ref 26.0–34.0)
MCHC: 31.5 g/dL (ref 30.0–36.0)
MCV: 92.3 fL (ref 80.0–100.0)
Monocytes Absolute: 0.7 10*3/uL (ref 0.1–1.0)
Monocytes Relative: 10 %
Neutro Abs: 5.1 10*3/uL (ref 1.7–7.7)
Neutrophils Relative %: 72 %
Platelets: 355 10*3/uL (ref 150–400)
RBC: 3.78 MIL/uL — ABNORMAL LOW (ref 3.87–5.11)
RDW: 13.3 % (ref 11.5–15.5)
WBC: 7 10*3/uL (ref 4.0–10.5)
nRBC: 0 % (ref 0.0–0.2)

## 2019-06-20 LAB — URINALYSIS, ROUTINE W REFLEX MICROSCOPIC
Bilirubin Urine: NEGATIVE
Glucose, UA: NEGATIVE mg/dL
Ketones, ur: NEGATIVE mg/dL
Nitrite: NEGATIVE
Protein, ur: 30 mg/dL — AB
Specific Gravity, Urine: 1.025 (ref 1.005–1.030)
pH: 6 (ref 5.0–8.0)

## 2019-06-20 LAB — COMPREHENSIVE METABOLIC PANEL
ALT: 8 U/L (ref 0–44)
AST: 18 U/L (ref 15–41)
Albumin: 3.2 g/dL — ABNORMAL LOW (ref 3.5–5.0)
Alkaline Phosphatase: 36 U/L — ABNORMAL LOW (ref 38–126)
Anion gap: 9 (ref 5–15)
BUN: 14 mg/dL (ref 8–23)
CO2: 23 mmol/L (ref 22–32)
Calcium: 9.5 mg/dL (ref 8.9–10.3)
Chloride: 101 mmol/L (ref 98–111)
Creatinine, Ser: 1.08 mg/dL — ABNORMAL HIGH (ref 0.44–1.00)
GFR calc Af Amer: 53 mL/min — ABNORMAL LOW (ref >60)
GFR calc non Af Amer: 46 mL/min — ABNORMAL LOW (ref >60)
Glucose, Bld: 104 mg/dL — ABNORMAL HIGH (ref 70–99)
Potassium: 4.4 mmol/L (ref 3.5–5.1)
Sodium: 133 mmol/L — ABNORMAL LOW (ref 135–145)
Total Bilirubin: 0.4 mg/dL (ref 0.3–1.2)
Total Protein: 6.4 g/dL — ABNORMAL LOW (ref 6.5–8.1)

## 2019-06-20 LAB — URINALYSIS, MICROSCOPIC (REFLEX)

## 2019-06-20 LAB — LIPASE, BLOOD: Lipase: 30 U/L (ref 11–51)

## 2019-06-20 MED ORDER — ONDANSETRON HCL 4 MG/2ML IJ SOLN
4.0000 mg | Freq: Once | INTRAMUSCULAR | Status: AC
  Administered 2019-06-20: 17:00:00 4 mg via INTRAVENOUS
  Filled 2019-06-20: qty 2

## 2019-06-20 MED ORDER — SODIUM CHLORIDE 0.9 % IV BOLUS
500.0000 mL | Freq: Once | INTRAVENOUS | Status: AC
  Administered 2019-06-20: 17:00:00 500 mL via INTRAVENOUS

## 2019-06-20 MED ORDER — IOHEXOL 300 MG/ML  SOLN
100.0000 mL | Freq: Once | INTRAMUSCULAR | Status: AC | PRN
  Administered 2019-06-20: 18:00:00 100 mL via INTRAVENOUS

## 2019-06-20 MED ORDER — HYDROCODONE-ACETAMINOPHEN 5-325 MG PO TABS: 1.0000 | ORAL_TABLET | Freq: Four times a day (QID) | ORAL | 0 refills | Status: AC | PRN

## 2019-06-20 MED ORDER — SODIUM CHLORIDE 0.9 % IV SOLN: INTRAVENOUS | Status: DC

## 2019-06-20 NOTE — ED Provider Notes (Signed)
[facility] Provider Note   CSN: [id number] Arrival date & time: 06/20/19  1512     History Chief Complaint  Patient presents with  . Abdominal Pain  . Urinary Frequency    Jeanette Brady is a 86 y.o. female.  HPI   Patient presents to the ED for evaluation of abdominal pain.  Patient started having symptoms within the last week.  Patient states she has been having pain in the left side of her abdomen.  It also feels like it swollen.  Triage note indicates urinary frequency however the patient denies any pain or discomfort with urination.  She has had nausea and vomiting but denies any fevers or chills.  She denies any prior history of abdominal pain issues.  Patient states she went to her doctor today and was sent to the ED for further evaluation  Past Medical History:  Diagnosis Date  . Anemia   . Arthritis   . Cataract   . Chronic kidney disease   . Heart murmur   . Hypertension   . 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 Thyroid disease     Patient Active Problem List   Diagnosis Date Noted  . Intention tremor 05/01/2016  . 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 Primary osteoarthritis involving multiple joints 01/25/2016  . Personal history of benign brain tumor 01/25/2016  . Syncope 10/15/2015  . Hypertension 10/15/2015  . Hypothyroidism 10/15/2015  . Heart murmur 10/15/2015  . Kidney disease 10/15/2015  . Normocytic anemia 10/15/2015  . SHOULDER PAIN 06/09/2008    Past Surgical History:  Procedure Laterality Date  . BACK SURGERY    . EYE SURGERY    . JOINT REPLACEMENT    . REPLACEMENT TOTAL KNEE BILATERAL    . SPINE SURGERY    . YAG LASER APPLICATION Right [id number]   Procedure: YAG LASER APPLICATION;  Surgeon: Rutherford Guys, MD;  Location: [facility];  Service: Ophthalmology;  Laterality: Right;     OB History    Gravida  2   Para  2   Term  2   Preterm      AB      Living  2     SAB      TAB      Ectopic      Multiple        Live Births              Family History  Problem Relation Age of Onset  . Heart attack Mother   . Heart disease Mother   . Kidney disease Mother   . Kidney disease Sister        on dialysis   . Hypertension Daughter   . Hypercholesterolemia Daughter   . Anxiety disorder Daughter   . Cancer Sister   . COPD Sister   . Heart disease Sister   . Syncope episode Neg Hx     Social History   Tobacco Use  . Smoking status: Former Smoker    Packs/day: 0.05    Years: 20.00    Pack years: 1.00    Types: Cigarettes    Quit date: 03/06/1993    Years since quitting: 26.3  . Smokeless tobacco: Current User    Types: Snuff  Substance Use Topics  . Alcohol use: No  . Drug use: No    Home Medications Prior to Admission medications   Medication Sig Start Date End Date Taking? Authorizing Provider  alendronate (FOSAMAX) 70 MG tablet  Take 70 mg by mouth once a week. Take with a full glass of water on an empty stomach.    [provider]  amLODipine (NORVASC) 5 MG tablet TAKE 1 TABLET EVERY DAY 01/29/17   Raylene Everts, MD  aspirin EC 81 MG tablet Take 81 mg by mouth daily.    [provider]  cephALEXin (KEFLEX) 500 MG capsule Take 1 capsule (500 mg total) by mouth 4 (four) times daily. 06/03/19   Triplett, Tammy, PA-C  Cholecalciferol (VITAMIN D) 50 MCG (2000 UT) tablet Take 2,000 Units by mouth daily. 12/09/18   [provider]  Cholecalciferol (VITAMIN D3) 50000 units CAPS Take 1 capsule by mouth every 30 (thirty) days. 05/11/17   Raylene Everts, MD  furosemide (LASIX) 20 MG tablet Take 1 tablet (20 mg total) by mouth daily. 10/30/16   Raylene Everts, MD  gabapentin (NEURONTIN) 300 MG capsule TAKE 1 CAPSULE BY MOUTH THREE TIMES A DAY Patient not taking: Reported on 05/20/2019 06/15/17   Raylene Everts, MD  HYDROcodone-acetaminophen (NORCO/VICODIN) 5-325 MG tablet Take 1 tablet by mouth every 6 (six) hours as needed. 06/20/19   Dorie Rank, MD   ibuprofen (ADVIL,MOTRIN) 600 MG tablet Take 1 tablet (600 mg total) by mouth every 8 (eight) hours as needed. Arthritis pain 05/11/17   Raylene Everts, MD  levothyroxine (SYNTHROID, LEVOTHROID) 100 MCG tablet TAKE 1 TABLET BY MOUTH EVERY DAY 06/21/17   Raylene Everts, MD  losartan (COZAAR) 100 MG tablet TAKE 1 TABLET BY MOUTH EVERY DAY 06/21/17   Raylene Everts, MD  [facility]) 10 MG tablet  06/03/18   [provider]  traMADol-acetaminophen (ULTRACET) 37.5-325 MG tablet TAKE 1 TABLET BY MOUTH EVERY 4 HOURS AS NEEDED 06/13/16   Raylene Everts, MD  valsartan (DIOVAN) 320 MG tablet  06/03/18   [provider]    Allergies    Patient has no known allergies.  Review of Systems   Review of Systems  All other systems reviewed and are negative.   Physical Exam Updated Vital Signs BP (!) 160/103   Pulse 78   Temp 98.3 F (36.8 C)   Resp 20   SpO2 99%   Physical Exam Vitals and nursing note reviewed.  Constitutional:      Appearance: She is not toxic-appearing or diaphoretic.  HENT:     Head: Normocephalic and atraumatic.     Right Ear: External ear normal.     Left Ear: External ear normal.  Eyes:     General: No scleral icterus.       Right eye: No discharge.        Left eye: No discharge.     Conjunctiva/sclera: Conjunctivae normal.  Neck:     Trachea: No tracheal deviation.  Cardiovascular:     Rate and Rhythm: Normal rate and regular rhythm.  Pulmonary:     Effort: Pulmonary effort is normal. No respiratory distress.     Breath sounds: Normal breath sounds. No stridor. No wheezing or rales.  Abdominal:     General: Bowel sounds are normal. There is no distension.     Palpations: Abdomen is soft.     Tenderness: There is abdominal tenderness in the left upper quadrant and left lower quadrant. There is guarding. There is no rebound.     Hernia: No hernia is present.  Musculoskeletal:        General: No tenderness.     Cervical back:  Neck supple.  Skin:    General: Skin is warm and dry.     Findings: No rash.  Neurological:     Mental Status: She is alert.     Cranial Nerves: No cranial nerve deficit (no facial droop, extraocular movements intact, no slurred speech).     Sensory: No sensory deficit.     Motor: No abnormal muscle tone or seizure activity.     Coordination: Coordination normal.     ED Results / Procedures / Treatments   Labs (all labs ordered are listed, but only abnormal results are displayed) Labs Reviewed  COMPREHENSIVE METABOLIC PANEL - Abnormal; Notable for the following components:      Result Value   Sodium 133 (*)    Glucose, Bld 104 (*)    Creatinine, Ser 1.08 (*)    Total Protein 6.4 (*)    Albumin 3.2 (*)    Alkaline Phosphatase 36 (*)    GFR calc non Af Amer 46 (*)    GFR calc Af Amer 53 (*)    All other components within normal limits  CBC WITH DIFFERENTIAL/PLATELET - Abnormal; Notable for the following components:   RBC 3.78 (*)    Hemoglobin 11.0 (*)    HCT 34.9 (*)    All other components within normal limits  URINALYSIS, ROUTINE W REFLEX MICROSCOPIC - Abnormal; Notable for the following components:   Hgb urine dipstick LARGE (*)    Protein, ur 30 (*)    Leukocytes,Ua MODERATE (*)    All other components within normal limits  URINALYSIS, MICROSCOPIC (REFLEX) - Abnormal; Notable for the following components:   Bacteria, UA FEW (*)    Non Squamous Epithelial PRESENT (*)    All other components within normal limits  LIPASE, BLOOD    EKG None  Radiology CT ABDOMEN PELVIS W CONTRAST  Result Date: 06/20/2019 CLINICAL DATA:  Left-sided abdominal pain, diverticulitis suspected EXAM: CT ABDOMEN AND PELVIS WITH CONTRAST TECHNIQUE: Multidetector CT imaging of the abdomen and pelvis was performed using the standard protocol following bolus administration of intravenous contrast. CONTRAST:  155mL OMNIPAQUE IOHEXOL 300 MG/ML  SOLN COMPARISON:  10/31/2014 FINDINGS: Lower chest:  Small bilateral pleural effusions and associated atelectasis or consolidation. Extensive 3 vessel coronary artery calcifications and/or stents. Multiple small nodules of the included lower lungs, measuring 2 mm or smaller, generally stable compared to prior examination dated 10/31/2014 and likely benign. Hepatobiliary: No solid liver abnormality is seen. Numerous liver cysts or hemangiomata, unchanged compared to prior no gallstones, gallbladder wall thickening, or biliary dilatation. Pancreas: Unremarkable. No pancreatic ductal dilatation or surrounding inflammatory changes. Spleen: Normal in size without significant abnormality. Adrenals/Urinary Tract: Adrenal glands are unremarkable. Kidneys are normal, without renal calculi, solid lesion, or hydronephrosis. Bladder is unremarkable. Stomach/Bowel: Stomach is within normal limits. Appendix appears normal. No evidence of bowel wall thickening, distention, or inflammatory changes. Vascular/Lymphatic: Aortic atherosclerosis. No enlarged abdominal or pelvic lymph nodes. Reproductive: Fibroid uterus. There is new fluid within the endometrial cavity. Other: No abdominal wall hernia or abnormality. New moderate volume ascites throughout the abdomen and pelvis. There is extensive caking of the omentum (series 3, image 43) and more subtle thickening and nodularity of the peritoneum, particularly in the pelvis (series 3, image 64). Musculoskeletal: No acute or significant osseous findings. IMPRESSION: 1. There is new moderate volume ascites throughout the abdomen and pelvis with extensive soft tissue caking of the omentum and more subtle thickening and nodularity of the peritoneum, findings consistent with peritoneal metastatic disease. 2. There is  new fluid within the endometrial cavity, suspicious for endometrial malignancy. 3. There is no overt evidence of pulmonary or thoracic metastatic disease, however new effusions are generally suspicious for malignant effusions  given other findings. There are multiple small, stable pulmonary nodules in the included bilateral lung bases when compared to examination dated 2016, benign. 4.  Coronary artery disease.  Aortic Atherosclerosis (ICD10-I70.0). Electronically Signed   By: Eddie Candle M.D.   On: 06/20/2019 18:51    Procedures Procedures (including critical care time)  Medications Ordered in ED Medications  sodium chloride 0.9 % bolus 500 mL (0 mLs Intravenous Stopped 06/20/19 1753)    And  0.9 %  sodium chloride infusion ( Intravenous Rate/Dose Change 06/20/19 1832)  ondansetron (ZOFRAN) injection 4 mg (4 mg Intravenous Given 06/20/19 1722)  iohexol (OMNIPAQUE) 300 MG/ML solution 100 mL (100 mLs Intravenous Contrast Given 06/20/19 1812)    ED Course  I have reviewed the triage vital signs and the nursing notes.  Pertinent labs & imaging results that were available during my care of the patient were reviewed by me and considered in my medical decision making (see chart for details).  Clinical Course as of Jun 20 1938  Fri Jun 20, 2019  1856 White blood cell count normal.   [JK]  1856 Anemia stable.  Electrolyte panel without any serious abnormalities.   [id number] Urinalysis does show a few bacteria as well as moderate leukocyte esterase   [JK]  1857 CT scan concerning for metastatic cancer   [JK]    Clinical Course User Index [JK] Dorie Rank, MD   MDM Rules/Calculators/A&P                      Pt presented with lower abd pain.  Sx concerning for possible diverticulitis, pylenophritis.  Labs and UA unremarkable.  CT unfortunately shows peritoneal carcinomatosis, ascites and findings suggestive of metastatic endometrial carcinoma.  I discussed the findings with the patient.  She would prefer not to be in the hospital unless necessary.  I discussed the case with Dr. Denman George, GYN oncology.  She will follow up with the patient in her office next week.  I will discharge patient home with a prescription for  pain medications. Final Clinical Impression(s) / ED Diagnoses Final diagnoses:  Peritoneal carcinomatosis [facility])  Metastatic malignant neoplasm, unspecified site [facility])    Rx / DC Orders ED Discharge Orders         Ordered    HYDROcodone-acetaminophen (NORCO/VICODIN) 5-325 MG tablet  Every 6 hours PRN     06/20/19 1937           Dorie Rank, MD 06/20/19 1940

## 2019-06-20 NOTE — ED Triage Notes (Signed)
Abdominal pain with urinary frequency x 1 week

## 2019-06-20 NOTE — Discharge Instructions (Signed)
The CT scan unfortunately suggests cancer possibly from the uterus causing the pain in your abdomen.  Call Dr Serita Grit office to set up an appointment to be seen next week.  They may call you to schedule an appointment as well. Take the medications as needed for pain

## 2019-06-23 ENCOUNTER — Ambulatory Visit (HOSPITAL_COMMUNITY)
Admission: RE | Admit: 2019-06-23 | Discharge: 2019-06-23 | Disposition: A | Discharge status: Home / Self Care | Payer: PPO | Source: Ambulatory Visit | Attending: Internal Medicine | Admitting: Internal Medicine

## 2019-06-23 DIAGNOSIS — R1010 Upper abdominal pain, unspecified: Secondary | ICD-10-CM | POA: Diagnosis not present

## 2019-06-23 DIAGNOSIS — E782 Mixed hyperlipidemia: Secondary | ICD-10-CM | POA: Diagnosis not present

## 2019-06-23 DIAGNOSIS — R188 Other ascites: Secondary | ICD-10-CM | POA: Diagnosis not present

## 2019-06-23 DIAGNOSIS — N281 Cyst of kidney, acquired: Secondary | ICD-10-CM | POA: Diagnosis not present

## 2019-06-23 DIAGNOSIS — R600 Localized edema: Secondary | ICD-10-CM | POA: Diagnosis not present

## 2019-06-23 DIAGNOSIS — I10 Essential (primary) hypertension: Secondary | ICD-10-CM | POA: Diagnosis not present

## 2019-06-23 DIAGNOSIS — E039 Hypothyroidism, unspecified: Secondary | ICD-10-CM | POA: Diagnosis not present

## 2019-06-26 ENCOUNTER — Other Ambulatory Visit: Payer: Self-pay

## 2019-06-26 ENCOUNTER — Telehealth: Payer: Self-pay | Admitting: *Deleted

## 2019-06-26 NOTE — Telephone Encounter (Signed)
Called and moved the patient's appt from the afternoon to the morning

## 2019-06-27 ENCOUNTER — Inpatient Hospital Stay: Payer: PPO | Admitting: Gynecologic Oncology

## 2019-06-30 NOTE — Progress Notes (Signed)
GYNECOLOGIC ONCOLOGY NEW PATIENT CONSULTATION   Patient Name: Jeanette Brady  Patient Age: 86 y.o. Date of Service: 07/01/19 Referring Provider: Celene Squibb, MD [place],  [place] [id number]   Primary Care Provider: Celene Squibb, MD Consulting Provider: Jeral Pinch, MD   Assessment/Plan:  Postmenopausal patient with significant symptom burden, deconditioning, and imaging findings concerning for metastatic carcinoma of GYN origin.  Discussed in detail CT findings with the patient and her daughter in light of recent symptoms and exam findings today. Overall, findings are highly suspicious for metastatic carcinoma of GYN origin. I discussed that I believe she likely has at least stage IIIC ovarian cancer. We reviewed that the treatment approach for this disease is typically combination of cytoreductive surgery and chemotherapy. The sequencing of this can be either with upfront debulking followed by adjuvant chemotherapy sequentially or neoadjuvant chemotherapy followed by an interval cytoreductive attempt, then additional chemotherapy. This latter approach is associated with a reduced perioperative morbidity at the time of surgery. The goal of sequencing is to optimise the likelihood that cytoreductive effect can be optimal (to less than 1 cm of residual disease) and would not induce morbidity for the patient that would result in a delay of adjuvant chemotherapy. I discussed that it is an individual decision process that takes into account individual patient health, and preference factors, in addition to the apparent tumor distribution on imaging. I discussed that the overall survival observed in patients is equivalent for both approaches provided that there is an optimal cytoreductive effort at the time of surgery (regardless of the timing of that surgery). Given her disease burden, I recommend neoadjuvant chemotherapy as I do not think it would be possible to perform optimal  cytoreductive surgery at this time.  I discussed the need for dedicated CT of the chest to assess for any evidence of further pulmonary disease (bilateral small pleural effusions are suspicious for metastasis). Will get CA-125 today. Given fluid seen in the endometrial cavity, an EMB was attempted today but not possible. Will plan to proceed with ultrasound vs. CT-guided biopsy of the omentum and concurrent paracentesis for diagnosis and symptom relief. IR referral placed today.  The patient voiced wanting to proceed with therapy at today's visit. Given significant burden of traveling to [facility], the patient and her daughter would rather receive treatment at [facility]. I will call them once I have the results of her chest CT and biopsy results and will help coordinate having her seen by a medical oncologist at [facility]. The family met Santiago Glad, our nurse navigator today, who will help coordinate the patient's care and assure appropriate timing of a visit with me to assess for interval debulking surgery pending her response to NACT.  A copy of this note was sent to the patient's referring provider.   50 minutes of total time was spent for this patient encounter, including preparation, face-to-face counseling with the patient and coordination of care, and documentation of the encounter.  Jeral Pinch, MD  Division of Gynecologic Oncology  Department of Obstetrics and Gynecology  [facility]  ___________________________________________  Chief Complaint: Chief Complaint  Patient presents with  . Peritoneal carcinomatosis [facility])    New patient    History of Present Illness:  Jeanette Brady is a 86 y.o. y.o. 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  The patient was initially seen in the emergency department  for possible syncopal event on 3/30.  Work-up was largely unremarkable but given  concern for UTI, patient was treated presumptively.  She was seen again on 4/16 in the emergency department with abdominal pain x1 week.  CT concerning for peritoneal carcinomatosis and metastatic cancer.  The patient presents with her daughter today. The daughter describes that her mother has had one month of early satiety, abdominal pain, decreased appetite, abdominal swelling/bloating, nausea, episodes of emesis (with liquid consumption not food), weight loss, shortness of breath with any movement and constipation. The patient also endorses difficulty emptying her bladder. She describes the pain as feeling like a "big knot moving across" her upper abdomen with some radiation to bilateral lower quadrants. She denies vaginal bleeding or discharge.  She uses a walker to ambulate at home although has lost strength over the last month. She saw her PCP, Dr Nevada Crane in [place], earlier this month.  PAST MEDICAL HISTORY:  Past Medical History:  Diagnosis Date  . Anemia   . Arthritis   . Cataract   . Chronic kidney disease   . Gynecologic malignancy ([place]) 07/01/2019  . Heart murmur   . Hypertension   . 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 Thyroid disease      PAST SURGICAL HISTORY:  Past Surgical History:  Procedure Laterality Date  . BACK SURGERY    . EYE SURGERY    . JOINT REPLACEMENT    . REPLACEMENT TOTAL KNEE BILATERAL    . SPINE SURGERY    . YAG LASER APPLICATION Right 0/32/1224   Procedure: YAG LASER APPLICATION;  Surgeon: Rutherford Guys, MD;  Location: [facility];  Service: Ophthalmology;  Laterality: Right;    OB/GYN HISTORY:  OB History  Gravida Para Term Preterm AB Living  _0 SAB TAB Ectopic Multiple Live Births               # Outcome Date GA Lbr Len/2nd Weight Sex Delivery Anes PTL Lv  2 Term           1 Term             No LMP recorded. Patient is postmenopausal.  Age at menarche: 45  Age at menopause: 75 Hx of HRT: denies Hx of STDs: denies Last pap:  unsure, daughter thinks many years History of abnormal pap smears: no, although rare pap tests during life  SCREENING STUDIES:  Last mammogram: unsure, remote history  Last colonoscopy: 03/2009  MEDICATIONS: Outpatient Encounter Medications as of 07/01/2019  Medication Sig  . aspirin EC 81 MG tablet Take 81 mg by mouth daily.  . cephALEXin (KEFLEX) 500 MG capsule Take 1 capsule (500 mg total) by mouth 4 (four) times daily.  . Cholecalciferol (VITAMIN D) 50 MCG (2000 UT) tablet Take 2,000 Units by mouth daily.  . Cholecalciferol (VITAMIN D3) 50000 units CAPS Take 1 capsule by mouth every 30 (thirty) days.  . furosemide (LASIX) 20 MG tablet Take 1 tablet (20 mg total) by mouth daily.  Marland Kitchen gabapentin (NEURONTIN) 300 MG capsule TAKE 1 CAPSULE BY MOUTH THREE TIMES A DAY  . HYDROcodone-acetaminophen (NORCO/VICODIN) 5-325 MG tablet Take 1 tablet by mouth every 6 (six) hours as needed.  Marland Kitchen ibuprofen (ADVIL,MOTRIN) 600 MG tablet Take 1 tablet (600 mg total) by mouth every 8 (eight) hours as needed. Arthritis pain  . levothyroxine (SYNTHROID, LEVOTHROID) 100 MCG tablet TAKE 1 TABLET BY MOUTH EVERY DAY  . losartan (COZAAR) 100 MG tablet TAKE 1  TABLET BY MOUTH EVERY DAY  . memantine (NAMENDA) 10 MG tablet   . traMADol-acetaminophen (ULTRACET) 37.5-325 MG tablet TAKE 1 TABLET BY MOUTH EVERY 4 HOURS AS NEEDED  . valsartan (DIOVAN) 320 MG tablet   . [DISCONTINUED] alendronate (FOSAMAX) 70 MG tablet Take 70 mg by mouth once a week. Take with a full glass of water on an empty stomach.  . [DISCONTINUED] amLODipine (NORVASC) 5 MG tablet TAKE 1 TABLET EVERY DAY (Patient not taking: Reported on 07/01/2019)   No facility-administered encounter medications on file as of 07/01/2019.    ALLERGIES:  No Known Allergies   FAMILY HISTORY:  Family History  Problem Relation Age of Onset  . Heart attack Mother   . Heart disease Mother   . Kidney disease Mother   . Kidney disease Sister        on dialysis   .  Hypertension Daughter   . Hypercholesterolemia Daughter   . Anxiety disorder Daughter   . Cancer Sister        lung  . COPD Sister   . Heart disease Sister   . Syncope episode Neg Hx      SOCIAL HISTORY:    Social Connections:   . Frequency of Communication with Friends and Family:   . Frequency of Social Gatherings with Friends and Family:   . Attends Religious Services:   . Active Member of Clubs or Organizations:   . Attends Archivist Meetings:   Marland Kitchen Marital Status:     REVIEW OF SYSTEMS:  Pertinent positives as per HPI. Decreased appetite, hearing loss, ringing in ears, wheezing, cough, chest pain, leg swelling, hot flashes, joint pain, back pain, itching, dizziness, headaches also reported.  Denies fevers, chills, fatigue. Denies neck lumps or masses, mouth sores, or voice changes. Denies blood in stools, diarrhea. Denies pain with intercourse, dysuria, frequency, hematuria or incontinence. Denies pelvic pain, vaginal bleeding or vaginal discharge.   Denies back pain or muscle pain/cramps. Denies rash, or wounds. Denies numbness or seizures. Denies swollen lymph nodes or glands, denies easy bruising or bleeding. Denies anxiety, depression, confusion, or decreased concentration.  Physical Exam:  Vital Signs for this encounter:  Blood pressure (!) 141/73, pulse 82, temperature 98.3 F (36.8 C), temperature source Temporal, resp. rate 17, height _0  (1.575 m), SpO2 99 %. Body mass index is 23.59 kg/m. General: Alert, oriented, no acute distress.  HEENT: Normocephalic, atraumatic. Sclera anicteric.  Chest: Clear to auscultation bilaterally although decreased breath sounds at lung bases.  Audible expiratory wheezing, rhonchi, or rales. Cardiovascular: Regular rate and rhythm, no murmurs, rubs, or gallops.  Abdomen: Normoactive bowel sounds.  Firm, distended, palpable omental cake in the upper abdomen as well as mid and lower abdomen.  Diffusely mildly tender to  palpation.  Palpable fluid wave.  Extremities: Grossly normal range of motion. Warm, well perfused. 1-2+ edema bilaterally.  Skin: No rashes or lesions.  Lymphatics: No cervical, supraclavicular, or inguinal adenopathy.  GU:  Normal external female genitalia. No lesions. No discharge or bleeding.             Bladder/urethra:  No lesions or masses, well supported bladder             Vagina: Moderately atrophic, no lesions.             Cervix: Normal appearing and atrophic, no lesions.  Pap smear collected.  Biopsy attempted with verbal consent.  Cervix cleansed with Betadine x3.  Given cervical stenosis and patient intolerance,  not able to insert endometrial [place] into uterine cavity.             Uterus: Small, mobile, no parametrial involvement.  Some nodularity posteriorly, best appreciated on rectovaginal exam.  No involvement of the rectum noted.  Difficult to appreciate adnexa secondary to patient discomfort and abdominal distention.           LABORATORY AND RADIOLOGIC DATA:  Outside medical records were reviewed to synthesize the above history, along with the history and physical obtained during the visit.   Lab Results  Component Value Date   WBC 7.0 06/20/2019   HGB 11.0 (L) 06/20/2019   HCT 34.9 (L) 06/20/2019   PLT 355 06/20/2019   GLUCOSE 104 (H) 06/20/2019   CHOL 210 (H) 04/04/2016   TRIG 69 04/04/2016   HDL 86 04/04/2016   LDLCALC 110 (H) 04/04/2016   ALT 8 06/20/2019   AST 18 06/20/2019   NA 133 (L) 06/20/2019   K 4.4 06/20/2019   CL 101 06/20/2019   CREATININE 1.08 (H) 06/20/2019   BUN 14 06/20/2019   CO2 23 06/20/2019   TSH 0.40 05/11/2017   Albumin 3.2  CT A/P on 4/16: 1. There is new moderate volume ascites throughout the abdomen and pelvis with extensive soft tissue caking of the omentum and more subtle thickening and nodularity of the peritoneum, findings consistent with peritoneal metastatic disease. 2. There is new fluid within the endometrial cavity,  suspicious for endometrial malignancy. 3. There is no overt evidence of pulmonary or thoracic metastatic disease, however new effusions are generally suspicious for malignant effusions given other findings. There are multiple small, stable pulmonary nodules in the included bilateral lung bases when compared to examination dated 2016, benign. 4.  Coronary artery disease.  Aortic Atherosclerosis (ICD10-I70.0).  Abd ultrasound 4/19: 1. Extensive ascites as well as bilateral pleural effusions. 2. The gallbladder is not seen. The gallbladder was quite contracted on the prior CT scan of 06/20/2019. 3. Benign-appearing hepatic and right renal cysts.  CT head 3/30: 1. No acute intracranial abnormality. 2. Calcified extra-axial lesion in the left temporal region measuring 3.3 x 2.7 x 2.6 cm, either abuts or arises from the left temporal bone anterior to the mastoid air cells. Favor densely calcified meningioma, however MRI would be confirmatory. No surrounding edema. 3. Age related atrophy and chronic small vessel ischemia.

## 2019-07-01 ENCOUNTER — Other Ambulatory Visit (HOSPITAL_COMMUNITY)
Admission: RE | Admit: 2019-07-01 | Discharge: 2019-07-01 | Disposition: A | Discharge status: Home / Self Care | Payer: PPO | Source: Ambulatory Visit | Attending: Gynecologic Oncology | Admitting: Gynecologic Oncology

## 2019-07-01 ENCOUNTER — Inpatient Hospital Stay: Payer: PPO

## 2019-07-01 ENCOUNTER — Inpatient Hospital Stay: Payer: PPO | Attending: Gynecologic Oncology | Admitting: Gynecologic Oncology

## 2019-07-01 ENCOUNTER — Encounter: Payer: Self-pay | Admitting: Oncology

## 2019-07-01 ENCOUNTER — Encounter: Payer: Self-pay | Admitting: Gynecologic Oncology

## 2019-07-01 ENCOUNTER — Other Ambulatory Visit: Payer: Self-pay

## 2019-07-01 VITALS — BP 141/73 | HR 82 | Temp 98.3°F | Resp 17 | Ht 62.0 in

## 2019-07-01 DIAGNOSIS — R0602 Shortness of breath: Secondary | ICD-10-CM | POA: Diagnosis not present

## 2019-07-01 DIAGNOSIS — Z124 Encounter for screening for malignant neoplasm of cervix: Secondary | ICD-10-CM | POA: Insufficient documentation

## 2019-07-01 DIAGNOSIS — C801 Malignant (primary) neoplasm, unspecified: Secondary | ICD-10-CM | POA: Insufficient documentation

## 2019-07-01 DIAGNOSIS — C786 Secondary malignant neoplasm of retroperitoneum and peritoneum: Secondary | ICD-10-CM | POA: Insufficient documentation

## 2019-07-01 DIAGNOSIS — Z780 Asymptomatic menopausal state: Secondary | ICD-10-CM | POA: Diagnosis not present

## 2019-07-01 DIAGNOSIS — R188 Other ascites: Secondary | ICD-10-CM | POA: Diagnosis not present

## 2019-07-01 DIAGNOSIS — Z7982 Long term (current) use of aspirin: Secondary | ICD-10-CM | POA: Diagnosis not present

## 2019-07-01 DIAGNOSIS — M810 Age-related osteoporosis without current pathological fracture: Secondary | ICD-10-CM | POA: Insufficient documentation

## 2019-07-01 DIAGNOSIS — E079 Disorder of thyroid, unspecified: Secondary | ICD-10-CM | POA: Insufficient documentation

## 2019-07-01 DIAGNOSIS — I129 Hypertensive chronic kidney disease with stage 1 through stage 4 chronic kidney disease, or unspecified chronic kidney disease: Secondary | ICD-10-CM | POA: Insufficient documentation

## 2019-07-01 DIAGNOSIS — R634 Abnormal weight loss: Secondary | ICD-10-CM | POA: Insufficient documentation

## 2019-07-01 DIAGNOSIS — R140 Abdominal distension (gaseous): Secondary | ICD-10-CM | POA: Insufficient documentation

## 2019-07-01 DIAGNOSIS — N189 Chronic kidney disease, unspecified: Secondary | ICD-10-CM | POA: Insufficient documentation

## 2019-07-01 DIAGNOSIS — Z1273 Encounter for screening for malignant neoplasm of ovary: Secondary | ICD-10-CM | POA: Diagnosis not present

## 2019-07-01 DIAGNOSIS — Z79899 Other long term (current) drug therapy: Secondary | ICD-10-CM | POA: Diagnosis not present

## 2019-07-01 DIAGNOSIS — R109 Unspecified abdominal pain: Secondary | ICD-10-CM | POA: Insufficient documentation

## 2019-07-01 DIAGNOSIS — R971 Elevated cancer antigen 125 [CA 125]: Secondary | ICD-10-CM | POA: Diagnosis not present

## 2019-07-01 DIAGNOSIS — C579 Malignant neoplasm of female genital organ, unspecified: Secondary | ICD-10-CM | POA: Insufficient documentation

## 2019-07-01 DIAGNOSIS — R110 Nausea: Secondary | ICD-10-CM | POA: Diagnosis not present

## 2019-07-01 DIAGNOSIS — M1990 Unspecified osteoarthritis, unspecified site: Secondary | ICD-10-CM | POA: Insufficient documentation

## 2019-07-01 DIAGNOSIS — R6881 Early satiety: Secondary | ICD-10-CM | POA: Diagnosis not present

## 2019-07-01 DIAGNOSIS — R011 Cardiac murmur, unspecified: Secondary | ICD-10-CM | POA: Diagnosis not present

## 2019-07-01 HISTORY — DX: Malignant neoplasm of female genital organ, unspecified: C57.9

## 2019-07-01 LAB — PROTIME-INR
INR: 1.1 (ref 0.8–1.2)
Prothrombin Time: 13.8 seconds (ref 11.4–15.2)

## 2019-07-01 NOTE — Patient Instructions (Addendum)
It was a pleasure meeting you today.  We are working on getting the tissue biopsy and procedure to remove some of the fluid in your belly.  You will receive a phone call from the hospital to schedule this. This will help give [place] a diagnosis.  We have your chest CT scheduled for next week.  Once we have all of this information, I will call you to discuss recommendations for treatment, which will likely involve chemotherapy.  We can help coordinate this at [facility] if that is more convenient for you.  If you have any questions or concerns, please call the clinic at [phone].

## 2019-07-01 NOTE — Progress Notes (Signed)
Met with Jeanette Brady and her daughter after Dr. Charisse March appointment.  Encouraged them to call with any questions.

## 2019-07-02 ENCOUNTER — Encounter (HOSPITAL_COMMUNITY): Payer: Self-pay | Admitting: Radiology

## 2019-07-02 DIAGNOSIS — Z780 Asymptomatic menopausal state: Secondary | ICD-10-CM | POA: Diagnosis not present

## 2019-07-02 LAB — CA 125: Cancer Antigen (CA) 125: 559 U/mL — ABNORMAL HIGH (ref 0.0–38.1)

## 2019-07-02 NOTE — Progress Notes (Signed)
Jeanette Brady Female, 86 y.o., [birth year] MRN:  [id number] Phone:  [phone] (H) PCP:  Celene Squibb, MD Coverage:  [employer] Ppo Next Appt With Radiology ([facility]-CT 1) 07/09/2019 at 12:00 PM  RE: US Biopsy Received: Yesterday Message Contents  Aletta Edouard, MD  Garth Bigness D  OK for CT guided biopsy of omental tumor. Please ask PA's for orders for a gentle bowel prep of colon prior to procedure.   GY       Previous Messages   ----- Message -----  From: Garth Bigness D  Sent: 07/01/2019  3:02 PM EDT  To: Ir Procedure Requests  Subject: US Biopsy                     Procedure:  US Biopsy   Reason: Peritoneal carcinomatosis, paracentesis and omental biopsy for metastatic cancer, likely GYN origin   History: [place], CT in computer....MR, CT scheduled   Provider: Lafonda Mosses   Provider Contact: [phone]

## 2019-07-07 ENCOUNTER — Telehealth: Payer: Self-pay | Admitting: Gynecologic Oncology

## 2019-07-07 NOTE — Telephone Encounter (Signed)
Received fax from Dr. Juel Burrow office on 4/29 with patient's most recent visit note.  At that appointment, she presented with abdominal pain that had worsened, bloating, loss of appetite, constipation and emesis.  During her work-up in the emergency department for syncopal event, a CT of the head showed a 3.3 x 2.7 x 2.6 cm lesion abutting or arising from the left temporal bone thought to be a calcified meningioma.  MRI was recommended.  This is scheduled in early May.  Patient was advised to present to the emergency department for imaging to rule out bowel obstruction.  Lab testing was most recently performed on 4/9 with the following abnormal results: Hemoglobin 11, glucose 111, albumin 3.3.  Jeral Pinch MD Gynecologic Oncology

## 2019-07-09 ENCOUNTER — Other Ambulatory Visit: Payer: Self-pay

## 2019-07-09 ENCOUNTER — Ambulatory Visit (HOSPITAL_COMMUNITY)
Admission: RE | Admit: 2019-07-09 | Discharge: 2019-07-09 | Disposition: A | Discharge status: Home / Self Care | Payer: PPO | Source: Ambulatory Visit | Attending: Gynecologic Oncology | Admitting: Gynecologic Oncology

## 2019-07-09 DIAGNOSIS — C801 Malignant (primary) neoplasm, unspecified: Secondary | ICD-10-CM | POA: Diagnosis not present

## 2019-07-09 DIAGNOSIS — C786 Secondary malignant neoplasm of retroperitoneum and peritoneum: Secondary | ICD-10-CM | POA: Diagnosis not present

## 2019-07-09 DIAGNOSIS — I129 Hypertensive chronic kidney disease with stage 1 through stage 4 chronic kidney disease, or unspecified chronic kidney disease: Secondary | ICD-10-CM | POA: Diagnosis not present

## 2019-07-09 DIAGNOSIS — J90 Pleural effusion, not elsewhere classified: Secondary | ICD-10-CM | POA: Diagnosis not present

## 2019-07-09 DIAGNOSIS — J9811 Atelectasis: Secondary | ICD-10-CM | POA: Diagnosis not present

## 2019-07-09 DIAGNOSIS — C799 Secondary malignant neoplasm of unspecified site: Secondary | ICD-10-CM | POA: Diagnosis not present

## 2019-07-09 MED ORDER — IOHEXOL 300 MG/ML  SOLN
75.0000 mL | Freq: Once | INTRAMUSCULAR | Status: AC | PRN
  Administered 2019-07-09: 75 mL via INTRAVENOUS

## 2019-07-10 ENCOUNTER — Other Ambulatory Visit: Payer: Self-pay | Admitting: Radiology

## 2019-07-11 ENCOUNTER — Ambulatory Visit (HOSPITAL_COMMUNITY)
Admission: RE | Admit: 2019-07-11 | Discharge: 2019-07-11 | Disposition: A | Discharge status: Home / Self Care | Payer: PPO | Source: Ambulatory Visit | Attending: Gynecologic Oncology | Admitting: Gynecologic Oncology

## 2019-07-11 ENCOUNTER — Encounter (HOSPITAL_COMMUNITY): Payer: Self-pay

## 2019-07-11 ENCOUNTER — Other Ambulatory Visit: Payer: Self-pay

## 2019-07-11 ENCOUNTER — Telehealth (HOSPITAL_COMMUNITY): Payer: Self-pay

## 2019-07-11 DIAGNOSIS — Z8349 Family history of other endocrine, nutritional and metabolic diseases: Secondary | ICD-10-CM | POA: Diagnosis not present

## 2019-07-11 DIAGNOSIS — Z87891 Personal history of nicotine dependence: Secondary | ICD-10-CM | POA: Diagnosis not present

## 2019-07-11 DIAGNOSIS — E079 Disorder of thyroid, unspecified: Secondary | ICD-10-CM | POA: Diagnosis not present

## 2019-07-11 DIAGNOSIS — N189 Chronic kidney disease, unspecified: Secondary | ICD-10-CM | POA: Diagnosis not present

## 2019-07-11 DIAGNOSIS — Z8249 Family history of ischemic heart disease and other diseases of the circulatory system: Secondary | ICD-10-CM | POA: Insufficient documentation

## 2019-07-11 DIAGNOSIS — Z96653 Presence of artificial knee joint, bilateral: Secondary | ICD-10-CM | POA: Diagnosis not present

## 2019-07-11 DIAGNOSIS — Z825 Family history of asthma and other chronic lower respiratory diseases: Secondary | ICD-10-CM | POA: Diagnosis not present

## 2019-07-11 DIAGNOSIS — M1990 Unspecified osteoarthritis, unspecified site: Secondary | ICD-10-CM | POA: Insufficient documentation

## 2019-07-11 DIAGNOSIS — C786 Secondary malignant neoplasm of retroperitoneum and peritoneum: Secondary | ICD-10-CM | POA: Insufficient documentation

## 2019-07-11 DIAGNOSIS — R188 Other ascites: Secondary | ICD-10-CM | POA: Insufficient documentation

## 2019-07-11 DIAGNOSIS — Z79890 Hormone replacement therapy (postmenopausal): Secondary | ICD-10-CM | POA: Diagnosis not present

## 2019-07-11 DIAGNOSIS — E039 Hypothyroidism, unspecified: Secondary | ICD-10-CM | POA: Diagnosis not present

## 2019-07-11 DIAGNOSIS — I129 Hypertensive chronic kidney disease with stage 1 through stage 4 chronic kidney disease, or unspecified chronic kidney disease: Secondary | ICD-10-CM | POA: Diagnosis not present

## 2019-07-11 DIAGNOSIS — Z79899 Other long term (current) drug therapy: Secondary | ICD-10-CM | POA: Insufficient documentation

## 2019-07-11 DIAGNOSIS — Z7982 Long term (current) use of aspirin: Secondary | ICD-10-CM | POA: Diagnosis not present

## 2019-07-11 DIAGNOSIS — R180 Malignant ascites: Secondary | ICD-10-CM | POA: Diagnosis not present

## 2019-07-11 DIAGNOSIS — R197 Diarrhea, unspecified: Secondary | ICD-10-CM | POA: Diagnosis not present

## 2019-07-11 DIAGNOSIS — C801 Malignant (primary) neoplasm, unspecified: Secondary | ICD-10-CM | POA: Insufficient documentation

## 2019-07-11 DIAGNOSIS — Z833 Family history of diabetes mellitus: Secondary | ICD-10-CM | POA: Diagnosis not present

## 2019-07-11 DIAGNOSIS — R600 Localized edema: Secondary | ICD-10-CM | POA: Diagnosis not present

## 2019-07-11 DIAGNOSIS — E782 Mixed hyperlipidemia: Secondary | ICD-10-CM | POA: Diagnosis not present

## 2019-07-11 DIAGNOSIS — R1904 Left lower quadrant abdominal swelling, mass and lump: Secondary | ICD-10-CM | POA: Diagnosis not present

## 2019-07-11 DIAGNOSIS — I10 Essential (primary) hypertension: Secondary | ICD-10-CM | POA: Diagnosis not present

## 2019-07-11 LAB — CBC
HCT: 34.8 % — ABNORMAL LOW (ref 36.0–46.0)
Hemoglobin: 10.8 g/dL — ABNORMAL LOW (ref 12.0–15.0)
MCH: 27.9 pg (ref 26.0–34.0)
MCHC: 31 g/dL (ref 30.0–36.0)
MCV: 89.9 fL (ref 80.0–100.0)
Platelets: 550 10*3/uL — ABNORMAL HIGH (ref 150–400)
RBC: 3.87 MIL/uL (ref 3.87–5.11)
RDW: 13.4 % (ref 11.5–15.5)
WBC: 12.1 10*3/uL — ABNORMAL HIGH (ref 4.0–10.5)
nRBC: 0 % (ref 0.0–0.2)

## 2019-07-11 LAB — PROTIME-INR
INR: 1.2 (ref 0.8–1.2)
Prothrombin Time: 14.2 seconds (ref 11.4–15.2)

## 2019-07-11 MED ORDER — CLONIDINE HCL 0.1 MG PO TABS
0.1000 mg | ORAL_TABLET | Freq: Once | ORAL | Status: DC
  Filled 2019-07-11 (×3): qty 1

## 2019-07-11 MED ORDER — MIDAZOLAM HCL 2 MG/2ML IJ SOLN
INTRAMUSCULAR | Status: AC | PRN
  Administered 2019-07-11: 1 mg via INTRAVENOUS

## 2019-07-11 MED ORDER — FENTANYL CITRATE (PF) 100 MCG/2ML IJ SOLN
INTRAMUSCULAR | Status: AC | PRN
  Administered 2019-07-11: 50 ug via INTRAVENOUS

## 2019-07-11 MED ORDER — ACETAMINOPHEN 325 MG PO TABS
650.0000 mg | ORAL_TABLET | Freq: Once | ORAL | Status: AC
  Administered 2019-07-11: 650 mg via ORAL
  Filled 2019-07-11 (×2): qty 2

## 2019-07-11 MED ORDER — SODIUM CHLORIDE 0.9 % IV SOLN: INTRAVENOUS | Status: DC

## 2019-07-11 MED ORDER — IOHEXOL 300 MG/ML  SOLN
50.0000 mL | Freq: Once | INTRAMUSCULAR | Status: AC | PRN
  Administered 2019-07-11: 50 mL via INTRAVENOUS

## 2019-07-11 MED ORDER — LIDOCAINE HCL 1 % IJ SOLN
INTRAMUSCULAR | Status: AC
  Filled 2019-07-11: qty 20

## 2019-07-11 MED ORDER — MIDAZOLAM HCL 2 MG/2ML IJ SOLN
INTRAMUSCULAR | Status: AC
  Filled 2019-07-11: qty 2

## 2019-07-11 MED ORDER — FENTANYL CITRATE (PF) 100 MCG/2ML IJ SOLN
INTRAMUSCULAR | Status: AC
  Filled 2019-07-11: qty 2

## 2019-07-11 NOTE — Progress Notes (Signed)
Labs drawn and sent down.

## 2019-07-11 NOTE — Procedures (Signed)
Interventional Radiology Procedure Note  Procedure: CT guided biopsy Left omental mass  Complications: None  Estimated Blood Loss: None  Recommendations: - Bedrest x 1 hour - Path sent - DC home  Signed,  Criselda Peaches, MD

## 2019-07-11 NOTE — Discharge Instructions (Addendum)

## 2019-07-11 NOTE — Progress Notes (Signed)
The patient complains of right upper arm pain pre and post procedure. She requests tylenol. Larene Beach, [place] notified of this request. Also notified of vitals signs.

## 2019-07-11 NOTE — H&P (Signed)
Chief Complaint: Patient was seen in consultation today for omental mass biopsy with possible paracentesis.  Referring Physician(s): Tucker,Katherine R  Supervising Physician: Jacqulynn Cadet  Patient Status: [facility] - Out-pt  History of Present Illness: Jeanette Brady is a 86 y.o. 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 Jeanette Brady presented to [facility] ED on 06/20/19 with complaints of abdominal pain and distention x 1 week - imaging during that visit noted new moderate ascites with extensive soft tissue caking of the omentum as well as subtle thickening and nodularity of the peritoneum and fluid within the endometrial cavity suspicious for endometrial malignancy. She was referred to GYN oncology and was seen by Dr. 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  Jeanette Brady states she has not been feeling well for several weeks - her abdomen has continued to swell and is causing her a lot of discomfort, she has been eating and drinking at home but not very much because she gets full easily. She has had diarrhea (4-5 loose bowel movements everyday) for the last several days, she states that she cannot hold her bowel movements and "it just comes out." She understands what we are doing today and wishes to proceed as planned.  Past Medical History:  Diagnosis Date  . Anemia   . Arthritis   . Cataract   . Chronic kidney disease   . Gynecologic malignancy ([place]) 07/01/2019  . Heart murmur   . Hypertension   . 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 Thyroid disease     Past Surgical History:  Procedure Laterality Date  . BACK SURGERY    . EYE SURGERY    . JOINT REPLACEMENT    . REPLACEMENT TOTAL KNEE BILATERAL    . SPINE SURGERY    . YAG  LASER APPLICATION Right [id number]   Procedure: YAG LASER APPLICATION;  Surgeon: Rutherford Guys, MD;  Location: [facility] ORS;  Service: Ophthalmology;  Laterality: Right;    Allergies: Patient has no known allergies.  Medications: Prior to Admission medications   Medication Sig Start Date End Date Taking? Authorizing Provider  aspirin EC 81 MG tablet Take 81 mg by mouth daily.    [provider]  cephALEXin (KEFLEX) 500 MG capsule Take 1 capsule (500 mg total) by mouth 4 (four) times daily. 06/03/19   Triplett, Tammy, PA-C  Cholecalciferol (VITAMIN D) 50 MCG (2000 UT) tablet Take 2,000 Units by mouth daily. 12/09/18   [provider]  Cholecalciferol (VITAMIN D3) 50000 units CAPS Take 1 capsule by mouth every 30 (thirty) days. 05/11/17   Raylene Everts, MD  furosemide (LASIX) 20 MG tablet Take 1 tablet (20 mg total) by mouth daily. 10/30/16   Raylene Everts, MD  gabapentin (NEURONTIN) 300 MG capsule TAKE 1 CAPSULE BY MOUTH THREE TIMES A DAY 06/15/17   Raylene Everts, MD  HYDROcodone-acetaminophen (NORCO/VICODIN) 5-325 MG tablet Take 1 tablet by mouth every 6 (six) hours as needed. 06/20/19   Dorie Rank, MD  ibuprofen (ADVIL,MOTRIN) 600 MG tablet Take 1 tablet (600 mg total) by mouth every 8 (eight) hours as needed. Arthritis pain 05/11/17   Raylene Everts, MD  levothyroxine (SYNTHROID, LEVOTHROID) 100 MCG tablet TAKE 1 TABLET BY MOUTH EVERY DAY 06/21/17   Blanchie Serve  Collie Siad, MD  losartan (COZAAR) 100 MG tablet TAKE 1 TABLET BY MOUTH EVERY DAY 06/21/17   Raylene Everts, MD  memantine [facility]) 10 MG tablet  06/03/18   [provider]  traMADol-acetaminophen (ULTRACET) 37.5-325 MG tablet TAKE 1 TABLET BY MOUTH EVERY 4 HOURS AS NEEDED 06/13/16   Raylene Everts, MD  valsartan (DIOVAN) 320 MG tablet  06/03/18   [provider]     Family History  Problem Relation Age of Onset  . Heart attack Mother   . Heart disease Mother   . Kidney disease Mother   .  Kidney disease Sister        on dialysis   . Hypertension Daughter   . Hypercholesterolemia Daughter   . Anxiety disorder Daughter   . Cancer Sister        lung  . COPD Sister   . Heart disease Sister   . Syncope episode Neg Hx     Social History   Socioeconomic History  . Marital status: Married    Spouse name: Not on file  . Number of children: Not on file  . Years of education: Not on file  . Highest education level: Not on file  Occupational History  . Not on file  Tobacco Use  . Smoking status: Former Smoker    Packs/day: 0.05    Years: 20.00    Pack years: 1.00    Types: Cigarettes    Quit date: 03/06/1993    Years since quitting: 26.3  . Smokeless tobacco: Current User    Types: Snuff  Substance and Sexual Activity  . Alcohol use: No  . Drug use: No  . Sexual activity: Not Currently  Other Topics Concern  . Not on file  Social History Narrative  . Not on file   Social Determinants of Health   Financial Resource Strain:   . Difficulty of Paying Living Expenses:   Food Insecurity:   . Worried About Charity fundraiser in the Last Year:   . Arboriculturist in the Last Year:   Transportation Needs:   . Film/video editor (Medical):   Marland Kitchen Lack of Transportation (Non-Medical):   Physical Activity:   . Days of Exercise per Week:   . Minutes of Exercise per Session:   Stress:   . Feeling of Stress :   Social Connections:   . Frequency of Communication with Friends and Family:   . Frequency of Social Gatherings with Friends and Family:   . Attends Religious Services:   . Active Member of Clubs or Organizations:   . Attends Archivist Meetings:   Marland Kitchen Marital Status:      Review of Systems: A 12 point ROS discussed and pertinent positives are indicated in the HPI above.  All other systems are negative.  Review of Systems  Constitutional: Positive for appetite change and fatigue. Negative for chills and fever.  Respiratory: Negative for cough  and shortness of breath.   Cardiovascular: Negative for chest pain.  Gastrointestinal: Positive for abdominal distention, abdominal pain, diarrhea and nausea. Negative for blood in stool, constipation and vomiting.  Musculoskeletal: Negative for back pain.  Neurological: Negative for dizziness and headaches.    Vital Signs: BP (!) 161/73   Pulse 98   Temp 99 F (37.2 C) (Skin)   Resp 14   Ht 5\' 2"  (1.575 m)   Wt 130 lb (59 kg)   SpO2 100%   BMI 23.78 kg/m  Physical Exam Vitals reviewed.  Constitutional:      Appearance: She is ill-appearing.  HENT:     Head: Normocephalic.     Mouth/Throat:     Mouth: Mucous membranes are moist.     Pharynx: Oropharynx is clear. No oropharyngeal exudate or posterior oropharyngeal erythema.  Cardiovascular:     Rate and Rhythm: Normal rate and regular rhythm.  Pulmonary:     Effort: Pulmonary effort is normal.     Breath sounds: Normal breath sounds.  Abdominal:     General: There is distension.     Tenderness: There is abdominal tenderness.  Skin:    General: Skin is warm and dry.  Neurological:     Mental Status: She is alert and oriented to person, place, and time.  Psychiatric:        Mood and Affect: Mood normal.        Behavior: Behavior normal.        Thought Content: Thought content normal.        Judgment: Judgment normal.      MD Evaluation Airway: WNL Heart: WNL Abdomen: WNL Chest/ Lungs: WNL ASA  Classification: 3 Mallampati/Airway Score: One   Imaging: CT CHEST W CONTRAST  Result Date: 07/09/2019 CLINICAL DATA:  Pt had recent CT [facility] with findings concerning for metastatic carcinoma of GYN origin, possibly stage IIIC ovarian ca. Dedicated CT of the chest to assess for any evidence of further pulmonary disease (bilateral small pleural effusions are suspicious for metastasis). EXAM: CT CHEST WITH CONTRAST TECHNIQUE: Multidetector CT imaging of the chest was performed during intravenous contrast administration.  CONTRAST:  76mL OMNIPAQUE IOHEXOL 300 MG/ML  SOLN COMPARISON:  Abdomen and pelvis CT, 06/20/2019. FINDINGS: Cardiovascular: Heart is normal in size and configuration. No pericardial effusion. There are three-vessel coronary artery calcifications. Great vessels are normal in caliber. There is aortic atherosclerosis, but no dissection. Aortic branch vessels show mild atherosclerotic changes, but no significant stenosis. Mediastinum/Nodes: No neck base or axillary masses or enlarged lymph nodes. No mediastinal or hilar masses or enlarged lymph nodes. Trachea and esophagus are unremarkable. Lungs/Pleura: Moderate to large right and moderate left pleural effusions. There is dependent atelectasis in the posterior lungs mostly in the lower lobes. Mild atelectasis lies at the base of the right upper lobe adjacent to the minor fissure. Lungs are otherwise clear. There are no lung masses or nodules. No pneumothorax. Upper Abdomen: There are low attenuation liver masses as well as ascites, without change from the recent abdomen pelvis CT. No new or acute finding. Musculoskeletal: Mild depression of the upper endplate of L1 with an associated Schmorl's node that appears chronic. No evidence of an acute fracture. No osteoblastic or osteolytic lesions. Mild subcutaneous soft tissue edema. Bilateral dystrophic breast calcifications. IMPRESSION: 1. No evidence of metastatic disease/malignancy within the chest. 2. Moderate to large right and moderate left pleural effusions with associated dependent compressive atelectasis. 3. No evidence of pneumonia or pulmonary edema. No lung mass or nodule. 4. Coronary artery calcifications and aortic atherosclerosis. 5. Ascites and low-attenuation liver masses as noted on the recent prior abdomen and pelvis CT. Liver masses are most likely cysts. Aortic Atherosclerosis (ICD10-I70.0). Electronically Signed   By: Lajean Manes M.D.   On: 07/09/2019 17:17   US Abdomen Complete  Result Date:  06/23/2019 CLINICAL DATA:  Upper abdominal pain for 1 week. Ascites. Omental soft tissue masses consistent metastatic disease. Peritoneal thickening. EXAM: ABDOMEN ULTRASOUND COMPLETE COMPARISON:  CT scan dated 06/20/2019 FINDINGS: Gallbladder: The  gallbladder is not identified. It was small and contracted on the CT scan dated 06/20/2019. Common bile duct: Diameter: 3 mm, normal. Liver: Multiple simple cysts in the liver. Portal vein is patent on color Doppler imaging with normal direction of blood flow towards the liver. IVC: No abnormality visualized. Pancreas: Slight prominence of the pancreatic duct to 4 mm. No discrete mass. Spleen: Size and appearance within normal limits. Right Kidney: Length: 9.5 cm 11 mm cyst in the mid right kidney. Echogenicity within normal limits. No mass or hydronephrosis visualized. Left Kidney: Length: 8.1 cm. Echogenicity within normal limits. No mass or hydronephrosis visualized. Not well seen due to overlying bowel. Abdominal aorta: No aneurysm visualized. Other findings: Bilateral pleural effusions.  Extensive ascites. IMPRESSION: 1. Extensive ascites as well as bilateral pleural effusions. 2. The gallbladder is not seen. The gallbladder was quite contracted on the prior CT scan of 06/20/2019. 3. Benign-appearing hepatic and right renal cysts. Electronically Signed   By: Lorriane Shire M.D.   On: 06/23/2019 16:57   CT ABDOMEN PELVIS W CONTRAST  Result Date: 06/20/2019 CLINICAL DATA:  Left-sided abdominal pain, diverticulitis suspected EXAM: CT ABDOMEN AND PELVIS WITH CONTRAST TECHNIQUE: Multidetector CT imaging of the abdomen and pelvis was performed using the standard protocol following bolus administration of intravenous contrast. CONTRAST:  128mL OMNIPAQUE IOHEXOL 300 MG/ML  SOLN COMPARISON:  10/31/2014 FINDINGS: Lower chest: Small bilateral pleural effusions and associated atelectasis or consolidation. Extensive 3 vessel coronary artery calcifications and/or stents.  Multiple small nodules of the included lower lungs, measuring 2 mm or smaller, generally stable compared to prior examination dated 10/31/2014 and likely benign. Hepatobiliary: No solid liver abnormality is seen. Numerous liver cysts or hemangiomata, unchanged compared to prior no gallstones, gallbladder wall thickening, or biliary dilatation. Pancreas: Unremarkable. No pancreatic ductal dilatation or surrounding inflammatory changes. Spleen: Normal in size without significant abnormality. Adrenals/Urinary Tract: Adrenal glands are unremarkable. Kidneys are normal, without renal calculi, solid lesion, or hydronephrosis. Bladder is unremarkable. Stomach/Bowel: Stomach is within normal limits. Appendix appears normal. No evidence of bowel wall thickening, distention, or inflammatory changes. Vascular/Lymphatic: Aortic atherosclerosis. No enlarged abdominal or pelvic lymph nodes. Reproductive: Fibroid uterus. There is new fluid within the endometrial cavity. Other: No abdominal wall hernia or abnormality. New moderate volume ascites throughout the abdomen and pelvis. There is extensive caking of the omentum (series 3, image 43) and more subtle thickening and nodularity of the peritoneum, particularly in the pelvis (series 3, image 64). Musculoskeletal: No acute or significant osseous findings. IMPRESSION: 1. There is new moderate volume ascites throughout the abdomen and pelvis with extensive soft tissue caking of the omentum and more subtle thickening and nodularity of the peritoneum, findings consistent with peritoneal metastatic disease. 2. There is new fluid within the endometrial cavity, suspicious for endometrial malignancy. 3. There is no overt evidence of pulmonary or thoracic metastatic disease, however new effusions are generally suspicious for malignant effusions given other findings. There are multiple small, stable pulmonary nodules in the included bilateral lung bases when compared to examination dated  2016, benign. 4.  Coronary artery disease.  Aortic Atherosclerosis (ICD10-I70.0). Electronically Signed   By: Eddie Candle M.D.   On: 06/20/2019 18:51    Labs:  CBC: Recent Labs    06/03/19 1645 06/20/19 1701  WBC 6.9 7.0  HGB 11.3* 11.0*  HCT 36.0 34.9*  PLT 307 355    COAGS: Recent Labs    07/01/19 1303  INR 1.1    BMP: Recent Labs  06/03/19 1645 06/20/19 1701  NA 139 133*  K 3.9 4.4  CL 107 101  CO2 23 23  GLUCOSE 112* 104*  BUN 13 14  CALCIUM 9.4 9.5  CREATININE 1.08* 1.08*  GFRNONAA 46* 46*  GFRAA 53* 53*    LIVER FUNCTION TESTS: Recent Labs    06/20/19 1701  BILITOT 0.4  AST 18  ALT 8  ALKPHOS 36*  PROT 6.4*  ALBUMIN 3.2*    TUMOR MARKERS: No results for input(s): AFPTM, CEA, CA199, CHROMGRNA in the last 8760 hours.  Assessment and Plan:  86 y/o F with approximately 3 weeks of worsening abdominal distention and pain found to have moderate ascites with extensive soft tissue caking of the omentum as well as subtle thickening and nodularity of the peritoneum and fluid within the endometrial cavity suspicious for endometrial malignancy. IR has been asked to perform an image guided biopsy of the omental mass with possible paracentesis.  Patient has been NPO since 8 pm last night, she does not take any blood thinning medications. Afebrile, WBC 12.1, hgb 10.8, plt 550, INR 1.2.  Risks and benefits of omental mass biopsy with possible paracentesis was discussed with the patient and/or patient's family including, but not limited to bleeding, infection, damage to adjacent structures or low yield requiring additional tests.  All of the questions were answered and there is agreement to proceed.  Consent signed and in chart.  Thank you for this interesting consult.  I greatly enjoyed meeting Jeanette Brady and look forward to participating in their care.  A copy of this report was sent to the requesting provider on this date.  Electronically  Signed: Joaquim Nam, PA-C 07/11/2019, 10:20 AM   I spent a total of 30 Minutes in face to face in clinical consultation, greater than 50% of which was counseling/coordinating care for omental mass biopsy and possible paracentesis.

## 2019-07-14 LAB — CYTOLOGY - PAP

## 2019-07-15 ENCOUNTER — Emergency Department (HOSPITAL_COMMUNITY)
Admission: EM | Admit: 2019-07-15 | Discharge: 2019-07-15 | Disposition: A | Discharge status: Home / Self Care | Payer: PPO | Attending: Emergency Medicine | Admitting: Emergency Medicine

## 2019-07-15 ENCOUNTER — Other Ambulatory Visit: Payer: Self-pay

## 2019-07-15 ENCOUNTER — Telehealth: Payer: Self-pay | Admitting: Gynecologic Oncology

## 2019-07-15 ENCOUNTER — Emergency Department (HOSPITAL_COMMUNITY): Payer: PPO

## 2019-07-15 ENCOUNTER — Encounter: Payer: Self-pay | Admitting: Oncology

## 2019-07-15 ENCOUNTER — Ambulatory Visit (HOSPITAL_COMMUNITY): Payer: PPO

## 2019-07-15 ENCOUNTER — Encounter (HOSPITAL_COMMUNITY): Payer: Self-pay

## 2019-07-15 ENCOUNTER — Encounter (HOSPITAL_COMMUNITY): Payer: Self-pay | Admitting: Emergency Medicine

## 2019-07-15 DIAGNOSIS — R0902 Hypoxemia: Secondary | ICD-10-CM | POA: Diagnosis not present

## 2019-07-15 DIAGNOSIS — R1013 Epigastric pain: Secondary | ICD-10-CM | POA: Diagnosis not present

## 2019-07-15 DIAGNOSIS — I213 ST elevation (STEMI) myocardial infarction of unspecified site: Secondary | ICD-10-CM | POA: Diagnosis not present

## 2019-07-15 DIAGNOSIS — I129 Hypertensive chronic kidney disease with stage 1 through stage 4 chronic kidney disease, or unspecified chronic kidney disease: Secondary | ICD-10-CM | POA: Insufficient documentation

## 2019-07-15 DIAGNOSIS — R0789 Other chest pain: Secondary | ICD-10-CM | POA: Diagnosis not present

## 2019-07-15 DIAGNOSIS — E039 Hypothyroidism, unspecified: Secondary | ICD-10-CM | POA: Insufficient documentation

## 2019-07-15 DIAGNOSIS — Z7982 Long term (current) use of aspirin: Secondary | ICD-10-CM | POA: Diagnosis not present

## 2019-07-15 DIAGNOSIS — R188 Other ascites: Secondary | ICD-10-CM | POA: Diagnosis not present

## 2019-07-15 DIAGNOSIS — R079 Chest pain, unspecified: Secondary | ICD-10-CM | POA: Diagnosis not present

## 2019-07-15 DIAGNOSIS — J90 Pleural effusion, not elsewhere classified: Secondary | ICD-10-CM

## 2019-07-15 DIAGNOSIS — R180 Malignant ascites: Secondary | ICD-10-CM

## 2019-07-15 DIAGNOSIS — C579 Malignant neoplasm of female genital organ, unspecified: Secondary | ICD-10-CM

## 2019-07-15 DIAGNOSIS — N189 Chronic kidney disease, unspecified: Secondary | ICD-10-CM | POA: Insufficient documentation

## 2019-07-15 DIAGNOSIS — C801 Malignant (primary) neoplasm, unspecified: Secondary | ICD-10-CM

## 2019-07-15 DIAGNOSIS — Z87891 Personal history of nicotine dependence: Secondary | ICD-10-CM | POA: Insufficient documentation

## 2019-07-15 DIAGNOSIS — C786 Secondary malignant neoplasm of retroperitoneum and peritoneum: Secondary | ICD-10-CM

## 2019-07-15 DIAGNOSIS — Z96653 Presence of artificial knee joint, bilateral: Secondary | ICD-10-CM | POA: Insufficient documentation

## 2019-07-15 DIAGNOSIS — Z79899 Other long term (current) drug therapy: Secondary | ICD-10-CM | POA: Insufficient documentation

## 2019-07-15 DIAGNOSIS — R0602 Shortness of breath: Secondary | ICD-10-CM | POA: Diagnosis not present

## 2019-07-15 DIAGNOSIS — R0689 Other abnormalities of breathing: Secondary | ICD-10-CM | POA: Diagnosis not present

## 2019-07-15 DIAGNOSIS — D169 Benign neoplasm of bone and articular cartilage, unspecified: Secondary | ICD-10-CM

## 2019-07-15 DIAGNOSIS — C569 Malignant neoplasm of unspecified ovary: Secondary | ICD-10-CM | POA: Diagnosis not present

## 2019-07-15 DIAGNOSIS — R109 Unspecified abdominal pain: Secondary | ICD-10-CM | POA: Diagnosis not present

## 2019-07-15 LAB — URINALYSIS, MICROSCOPIC (REFLEX)
Bacteria, UA: NONE SEEN
WBC, UA: NONE SEEN WBC/hpf (ref 0–5)

## 2019-07-15 LAB — SURGICAL PATHOLOGY

## 2019-07-15 LAB — CBC WITH DIFFERENTIAL/PLATELET
Abs Immature Granulocytes: 0.13 10*3/uL — ABNORMAL HIGH (ref 0.00–0.07)
Basophils Absolute: 0.1 10*3/uL (ref 0.0–0.1)
Basophils Relative: 1 %
Eosinophils Absolute: 0.1 10*3/uL (ref 0.0–0.5)
Eosinophils Relative: 1 %
HCT: 34.4 % — ABNORMAL LOW (ref 36.0–46.0)
Hemoglobin: 10.8 g/dL — ABNORMAL LOW (ref 12.0–15.0)
Immature Granulocytes: 1 %
Lymphocytes Relative: 5 %
Lymphs Abs: 0.7 10*3/uL (ref 0.7–4.0)
MCH: 28.3 pg (ref 26.0–34.0)
MCHC: 31.4 g/dL (ref 30.0–36.0)
MCV: 90.1 fL (ref 80.0–100.0)
Monocytes Absolute: 1.2 10*3/uL — ABNORMAL HIGH (ref 0.1–1.0)
Monocytes Relative: 8 %
Neutro Abs: 11.9 10*3/uL — ABNORMAL HIGH (ref 1.7–7.7)
Neutrophils Relative %: 84 %
Platelets: 641 10*3/uL — ABNORMAL HIGH (ref 150–400)
RBC: 3.82 MIL/uL — ABNORMAL LOW (ref 3.87–5.11)
RDW: 13.7 % (ref 11.5–15.5)
WBC: 14.1 10*3/uL — ABNORMAL HIGH (ref 4.0–10.5)
nRBC: 0 % (ref 0.0–0.2)

## 2019-07-15 LAB — COMPREHENSIVE METABOLIC PANEL
ALT: 8 U/L (ref 0–44)
AST: 12 U/L — ABNORMAL LOW (ref 15–41)
Albumin: 2.4 g/dL — ABNORMAL LOW (ref 3.5–5.0)
Alkaline Phosphatase: 39 U/L (ref 38–126)
Anion gap: 12 (ref 5–15)
BUN: 23 mg/dL (ref 8–23)
CO2: 23 mmol/L (ref 22–32)
Calcium: 9.8 mg/dL (ref 8.9–10.3)
Chloride: 105 mmol/L (ref 98–111)
Creatinine, Ser: 1.08 mg/dL — ABNORMAL HIGH (ref 0.44–1.00)
GFR calc Af Amer: 53 mL/min — ABNORMAL LOW (ref >60)
GFR calc non Af Amer: 46 mL/min — ABNORMAL LOW (ref >60)
Glucose, Bld: 110 mg/dL — ABNORMAL HIGH (ref 70–99)
Potassium: 3.6 mmol/L (ref 3.5–5.1)
Sodium: 140 mmol/L (ref 135–145)
Total Bilirubin: 0.7 mg/dL (ref 0.3–1.2)
Total Protein: 6.2 g/dL — ABNORMAL LOW (ref 6.5–8.1)

## 2019-07-15 LAB — URINALYSIS, ROUTINE W REFLEX MICROSCOPIC
Bilirubin Urine: NEGATIVE
Glucose, UA: NEGATIVE mg/dL
Hgb urine dipstick: NEGATIVE
Leukocytes,Ua: NEGATIVE
Nitrite: NEGATIVE
Protein, ur: 30 mg/dL — AB
Specific Gravity, Urine: 1.01 (ref 1.005–1.030)
pH: 6 (ref 5.0–8.0)

## 2019-07-15 LAB — TROPONIN I (HIGH SENSITIVITY)
Troponin I (High Sensitivity): 14 ng/L (ref <18)
Troponin I (High Sensitivity): 14 ng/L (ref <18)

## 2019-07-15 LAB — D-DIMER, QUANTITATIVE: D-Dimer, Quant: 11.37 ug/mL-FEU — ABNORMAL HIGH (ref 0.00–0.50)

## 2019-07-15 LAB — LIPASE, BLOOD: Lipase: 18 U/L (ref 11–51)

## 2019-07-15 MED ORDER — GADOBUTROL 1 MMOL/ML IV SOLN
5.0000 mL | Freq: Once | INTRAVENOUS | Status: AC | PRN
  Administered 2019-07-15: 13:00:00 5 mL via INTRAVENOUS

## 2019-07-15 MED ORDER — IOHEXOL 350 MG/ML SOLN
100.0000 mL | Freq: Once | INTRAVENOUS | Status: AC | PRN
  Administered 2019-07-15: 100 mL via INTRAVENOUS

## 2019-07-15 MED ORDER — MORPHINE SULFATE (PF) 2 MG/ML IV SOLN
2.0000 mg | Freq: Once | INTRAVENOUS | Status: AC
  Administered 2019-07-15: 2 mg via INTRAVENOUS
  Filled 2019-07-15: qty 1

## 2019-07-15 NOTE — Telephone Encounter (Signed)
I called the patient's daughter with her recent biopsy results.  Biopsy confirms malignancy, most likely gynecologic.  Confirmed that they would like to proceed with chemotherapy and to be treated at [facility].  I have asked our nurse navigator to help place this referral to get her scheduled to see a medical oncologist there.  Jeral Pinch MD Gynecologic Oncology

## 2019-07-15 NOTE — ED Provider Notes (Signed)
Pt signed out by Dr. Dina Rich pending labs/CT scans.  Pt does have an elevated ddimer, but CTA neg for PE.  Pt has some ascites and pleural effusions; likely malignant.  She is not sob and she is oxygenating well.  Pt's 2nd troponin is 14 and unchanged.  Pt lives with her daughter who cares for pt.  Daughter has no help at home and requests some home health.  SW consulted and face-to-face form filled out.  MRI brain scheduled for this afternoon.  Daughter requested we do this here so she does not have to transport her back here.  We did.  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  Pt is stable for d/c.  Return if worse.   Isla Pence, MD 07/15/19 1357

## 2019-07-15 NOTE — Progress Notes (Signed)
Called Jeanette Brady (daughter) and advised her of appointment with Dr. Delton Coombes at [facility] on Tuesday, 07/22/19 at 8:15 am. Discussed that they should arrive 5-10 minutes early to check in and that the cancer center is located on the 4th floor of [facility].  Also that [name], Nurse Navigator will be contacting them before the appointment.  She verbalized understanding and agreement.

## 2019-07-15 NOTE — Progress Notes (Signed)
CSW in contact with patient and patient daughter, Jeanette Brady. Patients daughter reports that patient resides at home with her. She goes into detail and states that patient already has a wheel chair but could benefit from a bedside commode along with some help getting out of bed at night.   CSW explains that EDP has already put in orders for HH/PT/OT/RN/SW and that this was a temporary service. Family expressed understanding and was agreeable.  CSW in contact with Cassie with [facility] to inquire about referral. Cassie accepted referral. CSW to update family on home health provider.   [facility]  Clinical Social Worker  Ph: [phone]

## 2019-07-15 NOTE — ED Provider Notes (Signed)
[facility] Provider Note   CSN: [id number] Arrival date & time: 07/15/19  [id number]     History Chief Complaint  Patient presents with  . Chest Pain    Jeanette Brady is a 85 y.o. female.  HPI     This is an 86 year old female with a history of anemia, hypertension who presents with abdominal pain and chest pain.  Patient reports onset of abdominal pain yesterday evening.  She reports sharp epigastric pain that is nonradiating.  Currently her pain is 8 out of 10.  She also reports chest pain "sometimes."  She denies any current chest pain.  She denies any knowledge of vomiting, diarrhea.  No known sick contacts.  She does endorse a dry cough.  No fevers.  She was given nitroglycerin and aspirin by EMS with no improvement of her symptoms.  She has never had pain like this before.  Of note, patient with recent history of diagnosis of likely metastatic ovarian cancer.  She is currently undergoing work-up.  Past Medical History:  Diagnosis Date  . Anemia   . Arthritis   . Cataract   . Chronic kidney disease   . Gynecologic malignancy ([place]) 07/01/2019  . Heart murmur   . Hypertension   . 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 Thyroid disease     Patient Active Problem List   Diagnosis Date Noted  . Gynecologic malignancy ([place]) 07/01/2019  . Peritoneal carcinomatosis ([place]) 07/01/2019  . Intention tremor 05/01/2016  . 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 Primary osteoarthritis involving multiple joints 01/25/2016  . Personal history of benign brain tumor 01/25/2016  . Syncope 10/15/2015  . Hypertension 10/15/2015  . Hypothyroidism 10/15/2015  . Heart murmur 10/15/2015  . Kidney disease 10/15/2015  . Normocytic anemia 10/15/2015  . SHOULDER PAIN 06/09/2008    Past Surgical History:  Procedure Laterality Date  . BACK SURGERY    . EYE SURGERY    . JOINT REPLACEMENT    . REPLACEMENT TOTAL KNEE BILATERAL    . SPINE SURGERY    . YAG LASER APPLICATION  Right [id number]   Procedure: YAG LASER APPLICATION;  Surgeon: Rutherford Guys, MD;  Location: [facility];  Service: Ophthalmology;  Laterality: Right;     OB History    Gravida  2   Para  2   Term  2   Preterm      AB      Living  2     SAB      TAB      Ectopic      Multiple      Live Births              Family History  Problem Relation Age of Onset  . Heart attack Mother   . Heart disease Mother   . Kidney disease Mother   . Kidney disease Sister        on dialysis   . Hypertension Daughter   . Hypercholesterolemia Daughter   . Anxiety disorder Daughter   . Cancer Sister        lung  . COPD Sister   . Heart disease Sister   . Syncope episode Neg Hx     Social History   Tobacco Use  . Smoking status: Former Smoker    Packs/day: 0.05    Years: 20.00    Pack years: 1.00    Types: Cigarettes    Quit date: 03/06/1993    Years since quitting: 26.3  .  Smokeless tobacco: Current User    Types: Snuff  Substance Use Topics  . Alcohol use: No  . Drug use: No    Home Medications Prior to Admission medications   Medication Sig Start Date End Date Taking? Authorizing Provider  aspirin EC 81 MG tablet Take 81 mg by mouth daily.    [provider]  cephALEXin (KEFLEX) 500 MG capsule Take 1 capsule (500 mg total) by mouth 4 (four) times daily. 06/03/19   Triplett, Tammy, PA-C  Cholecalciferol (VITAMIN D) 50 MCG (2000 UT) tablet Take 2,000 Units by mouth daily. 12/09/18   [provider]  Cholecalciferol (VITAMIN D3) 50000 units CAPS Take 1 capsule by mouth every 30 (thirty) days. 05/11/17   Raylene Everts, MD  furosemide (LASIX) 20 MG tablet Take 1 tablet (20 mg total) by mouth daily. 10/30/16   Raylene Everts, MD  gabapentin (NEURONTIN) 300 MG capsule TAKE 1 CAPSULE BY MOUTH THREE TIMES A DAY 06/15/17   Raylene Everts, MD  HYDROcodone-acetaminophen (NORCO/VICODIN) 5-325 MG tablet Take 1 tablet by mouth every 6 (six) hours as needed. 06/20/19    Dorie Rank, MD  ibuprofen (ADVIL,MOTRIN) 600 MG tablet Take 1 tablet (600 mg total) by mouth every 8 (eight) hours as needed. Arthritis pain 05/11/17   Raylene Everts, MD  levothyroxine (SYNTHROID, LEVOTHROID) 100 MCG tablet TAKE 1 TABLET BY MOUTH EVERY DAY 06/21/17   Raylene Everts, MD  losartan (COZAAR) 100 MG tablet TAKE 1 TABLET BY MOUTH EVERY DAY 06/21/17   Raylene Everts, MD  memantine [facility]) 10 MG tablet  06/03/18   [provider]  traMADol-acetaminophen (ULTRACET) 37.5-325 MG tablet TAKE 1 TABLET BY MOUTH EVERY 4 HOURS AS NEEDED 06/13/16   Raylene Everts, MD  valsartan (DIOVAN) 320 MG tablet  06/03/18   [provider]    Allergies    Patient has no known allergies.  Review of Systems   Review of Systems  Constitutional: Negative for fever.  Respiratory: Positive for cough. Negative for shortness of breath.   Cardiovascular: Positive for chest pain. Negative for leg swelling.  Gastrointestinal: Positive for abdominal pain. Negative for diarrhea, nausea and vomiting.  Genitourinary: Negative for dysuria.  All other systems reviewed and are negative.   Physical Exam Updated Vital Signs BP (!) 163/80   Pulse (!) 104   Temp 98.2 F (36.8 C) (Oral)   Resp 13   Ht 1.549 m (5\' 1" )   Wt 59 kg   SpO2 95%   BMI 24.56 kg/m   Physical Exam Vitals and nursing note reviewed.  Constitutional:      Appearance: She is well-developed.     Comments: Elderly, nontoxic-appearing  HENT:     Head: Normocephalic and atraumatic.  Eyes:     Pupils: Pupils are equal, round, and reactive to light.  Cardiovascular:     Rate and Rhythm: Regular rhythm. Tachycardia present.     Heart sounds: Normal heart sounds.  Pulmonary:     Effort: Pulmonary effort is normal. No respiratory distress.     Breath sounds: No wheezing.  Abdominal:     General: Bowel sounds are normal.     Palpations: Abdomen is soft.     Tenderness: There is abdominal tenderness. There  is no guarding or rebound.     Comments: Epigastric tenderness to palpation, no rebound or guarding, abdomen mildly protuberant  Musculoskeletal:     Cervical back: Neck supple.  Skin:    General: Skin  is warm and dry.  Neurological:     Mental Status: She is alert and oriented to person, place, and time.  Psychiatric:        Mood and Affect: Mood normal.     ED Results / Procedures / Treatments   Labs (all labs ordered are listed, but only abnormal results are displayed) Labs Reviewed  CBC WITH DIFFERENTIAL/PLATELET - Abnormal; Notable for the following components:      Result Value   WBC 14.1 (*)    RBC 3.82 (*)    Hemoglobin 10.8 (*)    HCT 34.4 (*)    Platelets 641 (*)    Neutro Abs 11.9 (*)    Monocytes Absolute 1.2 (*)    Abs Immature Granulocytes 0.13 (*)    All other components within normal limits  COMPREHENSIVE METABOLIC PANEL - Abnormal; Notable for the following components:   Glucose, Bld 110 (*)    Creatinine, Ser 1.08 (*)    Total Protein 6.2 (*)    Albumin 2.4 (*)    AST 12 (*)    GFR calc non Af Amer 46 (*)    GFR calc Af Amer 53 (*)    All other components within normal limits  LIPASE, BLOOD  URINALYSIS, ROUTINE W REFLEX MICROSCOPIC  D-DIMER, QUANTITATIVE (NOT AT [facility])  TROPONIN I (HIGH SENSITIVITY)    EKG EKG Interpretation  Date/Time:  Tuesday Jul 15 2019 05:16:37 EDT Ventricular Rate:  107 PR Interval:    QRS Duration: 82 QT Interval:  339 QTC Calculation: 453 R Axis:   26 Text Interpretation: Sinus tachycardia Anterior infarct, old Borderline T abnormalities, inferior leads Confirmed by Thayer Jew [phone]) on 07/15/2019 6:25:47 AM   Radiology DG Abdomen Acute W/Chest  Result Date: 07/15/2019 CLINICAL DATA:  Chest and abdominal pain for 2 hours EXAM: DG ABDOMEN ACUTE W/ 1V CHEST COMPARISON:  Chest CT from 6 days ago FINDINGS: Pleural effusions on both sides. Lung volumes are low and there is atelectasis by CT. No air bronchogram or  pulmonary edema. Normal heart size. No pneumothorax. Nonobstructive bowel gas pattern.  No abnormal stool retention. Extensive atherosclerotic calcification. Advanced lumbar spine degeneration with scoliosis IMPRESSION: No acute intra-abdominal finding. Layering pleural effusions and lower lobe atelectasis as seen by recent CT. Electronically Signed   By: Monte Fantasia M.D.   On: 07/15/2019 06:08    Procedures Procedures (including critical care time)  Medications Ordered in ED Medications  morphine 2 MG/ML injection 2 mg (2 mg Intravenous Given 07/15/19 0555)    ED Course  I have reviewed the triage vital signs and the nursing notes.  Pertinent labs & imaging results that were available during my care of the patient were reviewed by me and considered in my medical decision making (see chart for details).    MDM Rules/Calculators/A&P                       Patient presents with abdominal pain.  Reported chest pain to EMS but reports mostly epigastric pain to me.  She is overall nontoxic.  She is elderly.  Heart rate low 100s but otherwise she is afebrile and vital signs are reassuring.  She is chronically ill-appearing but not acutely ill appearing.  She has some tenderness in the epigastrium.  She does report some intermittent chest pain but no chest pain at this time.  Recent diagnosis of likely stage III ovarian cancer.  She is undergoing work-up at this time.  EKG  shows no evidence of acute ischemia or arrhythmia.  Chest x-ray shows pleural effusions which were on prior CT scan.  She is certainly at risk for PE given her cancer diagnosis.  She is also tachycardic.  Will pursue imaging to rule out PE.  Additionally, will obtain CT imaging of the abdomen to assess for worsening burden of metastatic disease and rule out other etiology of acute pain.  Patient was given morphine.  Patient to be signed out to oncoming provider.  Final Clinical Impression(s) / ED Diagnoses Final diagnoses:    Atypical chest pain  Epigastric pain    Rx / DC Orders ED Discharge Orders    None       Merryl Hacker, MD 07/15/19 [phone]

## 2019-07-15 NOTE — Progress Notes (Signed)
Consult request has been received. CSW attempting to follow up at present time  Lacara Dunsworth M. Terryn Rosenkranz LCSWA Transitions of Care  Clinical Social Worker  Ph: [phone] 

## 2019-07-15 NOTE — ED Triage Notes (Signed)
Patient states chest pain started 2 hour ago. EMS gave 1 nitro and 324 ASA. EMS sent 12-lead to [facility] due to some EKG changes and [facility] stated that the 12-lead did not meet STEMI criteria. Patient states some chest pain at this time. Chest pain started about 30 minutes ago.

## 2019-07-16 DIAGNOSIS — J90 Pleural effusion, not elsewhere classified: Secondary | ICD-10-CM | POA: Diagnosis not present

## 2019-07-16 LAB — CYTOLOGY - NON PAP

## 2019-07-17 DIAGNOSIS — R112 Nausea with vomiting, unspecified: Secondary | ICD-10-CM | POA: Diagnosis not present

## 2019-07-17 DIAGNOSIS — R2689 Other abnormalities of gait and mobility: Secondary | ICD-10-CM | POA: Diagnosis not present

## 2019-07-17 DIAGNOSIS — E43 Unspecified severe protein-calorie malnutrition: Secondary | ICD-10-CM | POA: Diagnosis not present

## 2019-07-17 DIAGNOSIS — R1010 Upper abdominal pain, unspecified: Secondary | ICD-10-CM | POA: Diagnosis not present

## 2019-07-17 DIAGNOSIS — C569 Malignant neoplasm of unspecified ovary: Secondary | ICD-10-CM | POA: Diagnosis not present

## 2019-07-20 ENCOUNTER — Inpatient Hospital Stay (HOSPITAL_COMMUNITY)
Admission: EM | Admit: 2019-07-20 | Discharge: 2019-08-05 | DRG: 871 | Disposition: E | Payer: PPO | Attending: Internal Medicine | Admitting: Internal Medicine

## 2019-07-20 ENCOUNTER — Other Ambulatory Visit: Payer: Self-pay

## 2019-07-20 ENCOUNTER — Emergency Department (HOSPITAL_COMMUNITY): Payer: PPO

## 2019-07-20 ENCOUNTER — Encounter (HOSPITAL_COMMUNITY): Payer: Self-pay

## 2019-07-20 DIAGNOSIS — Z96653 Presence of artificial knee joint, bilateral: Secondary | ICD-10-CM | POA: Diagnosis not present

## 2019-07-20 DIAGNOSIS — E869 Volume depletion, unspecified: Secondary | ICD-10-CM | POA: Diagnosis present

## 2019-07-20 DIAGNOSIS — J90 Pleural effusion, not elsewhere classified: Secondary | ICD-10-CM | POA: Diagnosis not present

## 2019-07-20 DIAGNOSIS — K668 Other specified disorders of peritoneum: Secondary | ICD-10-CM

## 2019-07-20 DIAGNOSIS — K659 Peritonitis, unspecified: Secondary | ICD-10-CM | POA: Diagnosis present

## 2019-07-20 DIAGNOSIS — R52 Pain, unspecified: Secondary | ICD-10-CM | POA: Diagnosis not present

## 2019-07-20 DIAGNOSIS — C55 Malignant neoplasm of uterus, part unspecified: Secondary | ICD-10-CM | POA: Diagnosis present

## 2019-07-20 DIAGNOSIS — Z79890 Hormone replacement therapy (postmenopausal): Secondary | ICD-10-CM

## 2019-07-20 DIAGNOSIS — R198 Other specified symptoms and signs involving the digestive system and abdomen: Secondary | ICD-10-CM | POA: Diagnosis not present

## 2019-07-20 DIAGNOSIS — R1084 Generalized abdominal pain: Secondary | ICD-10-CM | POA: Diagnosis not present

## 2019-07-20 DIAGNOSIS — K5900 Constipation, unspecified: Secondary | ICD-10-CM | POA: Diagnosis present

## 2019-07-20 DIAGNOSIS — Z79891 Long term (current) use of opiate analgesic: Secondary | ICD-10-CM

## 2019-07-20 DIAGNOSIS — D631 Anemia in chronic kidney disease: Secondary | ICD-10-CM | POA: Diagnosis not present

## 2019-07-20 DIAGNOSIS — Z87891 Personal history of nicotine dependence: Secondary | ICD-10-CM

## 2019-07-20 DIAGNOSIS — I129 Hypertensive chronic kidney disease with stage 1 through stage 4 chronic kidney disease, or unspecified chronic kidney disease: Secondary | ICD-10-CM | POA: Diagnosis present

## 2019-07-20 DIAGNOSIS — E039 Hypothyroidism, unspecified: Secondary | ICD-10-CM | POA: Diagnosis present

## 2019-07-20 DIAGNOSIS — R000 Tachycardia, unspecified: Secondary | ICD-10-CM | POA: Diagnosis not present

## 2019-07-20 DIAGNOSIS — R188 Other ascites: Secondary | ICD-10-CM | POA: Diagnosis not present

## 2019-07-20 DIAGNOSIS — Z7982 Long term (current) use of aspirin: Secondary | ICD-10-CM

## 2019-07-20 DIAGNOSIS — Z841 Family history of disorders of kidney and ureter: Secondary | ICD-10-CM

## 2019-07-20 DIAGNOSIS — N183 Chronic kidney disease, stage 3 unspecified: Secondary | ICD-10-CM | POA: Diagnosis present

## 2019-07-20 DIAGNOSIS — R0902 Hypoxemia: Secondary | ICD-10-CM | POA: Diagnosis not present

## 2019-07-20 DIAGNOSIS — R109 Unspecified abdominal pain: Secondary | ICD-10-CM | POA: Diagnosis present

## 2019-07-20 DIAGNOSIS — J9601 Acute respiratory failure with hypoxia: Secondary | ICD-10-CM | POA: Diagnosis not present

## 2019-07-20 DIAGNOSIS — M810 Age-related osteoporosis without current pathological fracture: Secondary | ICD-10-CM | POA: Diagnosis not present

## 2019-07-20 DIAGNOSIS — Z79899 Other long term (current) drug therapy: Secondary | ICD-10-CM

## 2019-07-20 DIAGNOSIS — R6520 Severe sepsis without septic shock: Secondary | ICD-10-CM | POA: Diagnosis not present

## 2019-07-20 DIAGNOSIS — R0602 Shortness of breath: Secondary | ICD-10-CM

## 2019-07-20 DIAGNOSIS — Z66 Do not resuscitate: Secondary | ICD-10-CM | POA: Diagnosis present

## 2019-07-20 DIAGNOSIS — C801 Malignant (primary) neoplasm, unspecified: Secondary | ICD-10-CM | POA: Diagnosis not present

## 2019-07-20 DIAGNOSIS — K631 Perforation of intestine (nontraumatic): Secondary | ICD-10-CM | POA: Diagnosis not present

## 2019-07-20 DIAGNOSIS — Z515 Encounter for palliative care: Secondary | ICD-10-CM | POA: Diagnosis not present

## 2019-07-20 DIAGNOSIS — Z7189 Other specified counseling: Secondary | ICD-10-CM

## 2019-07-20 DIAGNOSIS — Z20822 Contact with and (suspected) exposure to covid-19: Secondary | ICD-10-CM | POA: Diagnosis not present

## 2019-07-20 DIAGNOSIS — I10 Essential (primary) hypertension: Secondary | ICD-10-CM | POA: Diagnosis present

## 2019-07-20 DIAGNOSIS — A419 Sepsis, unspecified organism: Secondary | ICD-10-CM

## 2019-07-20 DIAGNOSIS — N1832 Chronic kidney disease, stage 3b: Secondary | ICD-10-CM | POA: Diagnosis not present

## 2019-07-20 DIAGNOSIS — Z8249 Family history of ischemic heart disease and other diseases of the circulatory system: Secondary | ICD-10-CM | POA: Diagnosis not present

## 2019-07-20 DIAGNOSIS — J9600 Acute respiratory failure, unspecified whether with hypoxia or hypercapnia: Secondary | ICD-10-CM | POA: Diagnosis not present

## 2019-07-20 DIAGNOSIS — R54 Age-related physical debility: Secondary | ICD-10-CM | POA: Diagnosis present

## 2019-07-20 DIAGNOSIS — C786 Secondary malignant neoplasm of retroperitoneum and peritoneum: Secondary | ICD-10-CM | POA: Diagnosis present

## 2019-07-20 DIAGNOSIS — N1831 Chronic kidney disease, stage 3a: Secondary | ICD-10-CM | POA: Diagnosis not present

## 2019-07-20 DIAGNOSIS — R5381 Other malaise: Secondary | ICD-10-CM | POA: Diagnosis not present

## 2019-07-20 DIAGNOSIS — Z03818 Encounter for observation for suspected exposure to other biological agents ruled out: Secondary | ICD-10-CM | POA: Diagnosis not present

## 2019-07-20 DIAGNOSIS — R05 Cough: Secondary | ICD-10-CM | POA: Diagnosis not present

## 2019-07-20 DIAGNOSIS — D649 Anemia, unspecified: Secondary | ICD-10-CM | POA: Diagnosis present

## 2019-07-20 MED ORDER — HYDROMORPHONE HCL 1 MG/ML IJ SOLN: 0.5000 mg | Freq: Once | INTRAMUSCULAR | Status: DC

## 2019-07-20 MED ORDER — SODIUM CHLORIDE 0.9 % IV BOLUS
500.0000 mL | Freq: Once | INTRAVENOUS | Status: AC
  Administered 2019-07-21: 500 mL via INTRAVENOUS

## 2019-07-20 MED ORDER — FENTANYL CITRATE (PF) 100 MCG/2ML IJ SOLN
50.0000 ug | Freq: Once | INTRAMUSCULAR | Status: AC
  Administered 2019-07-21: 50 ug via INTRAVENOUS
  Filled 2019-07-20: qty 2

## 2019-07-20 NOTE — ED Provider Notes (Signed)
[facility] EMERGENCY DEPARTMENT Provider Note   CSN: [id number] Arrival date & time: 07/23/2019  2121     History Chief Complaint  Patient presents with  . Constipation    Jeanette Brady is a 86 y.o. female with history of CKD, hypertension, gynecologic malignancy, peritoneal carcinomatosis presents for evaluation of acute onset, persistent constipation for 9 days.  She underwent tissue biopsy on 07/11/2019 for confirmation of malignancy.  Daughter who I spoke with on the phone stated today that the patient complained that she has not moved her bowels since the seventh.  She believes her abdominal distention is worsening.  She reports she had some emesis today after drinking magnesium citrate.  Is also tried Gas-X and MiraLAX without relief.  On my initial assessment the patient is complaining to me of a cough that has been present for "a few days" but worsened today.  She is coughing up yellow-green thin secretions.  She denies hemoptysis.  She has bilateral lower extremity edema but states this is chronic for her.  She denies chest pain, shortness of breath, fevers.  Daughter has noted cough, generalized weakness.  She notes that her appetite has been poor and remains poor.  Patient is DNR.   The history is provided by the patient and a relative.       Past Medical History:  Diagnosis Date  . Anemia   . Arthritis   . Cataract   . Chronic kidney disease   . Gynecologic malignancy ([place]) 07/01/2019  . Heart murmur   . Hypertension   . 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 Thyroid disease     Patient Active Problem List   Diagnosis Date Noted  . Gynecologic malignancy ([place]) 07/01/2019  . Peritoneal carcinomatosis ([place]) 07/01/2019  . Intention tremor 05/01/2016  . 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 Primary osteoarthritis involving multiple joints 01/25/2016  . Personal history of benign brain tumor 01/25/2016  . Syncope 10/15/2015  . Hypertension 10/15/2015  .  Hypothyroidism 10/15/2015  . Heart murmur 10/15/2015  . Kidney disease 10/15/2015  . Normocytic anemia 10/15/2015  . SHOULDER PAIN 06/09/2008    Past Surgical History:  Procedure Laterality Date  . BACK SURGERY    . EYE SURGERY    . JOINT REPLACEMENT    . REPLACEMENT TOTAL KNEE BILATERAL    . SPINE SURGERY    . YAG LASER APPLICATION Right [id number]   Procedure: YAG LASER APPLICATION;  Surgeon: Rutherford Guys, MD;  Location: [facility];  Service: Ophthalmology;  Laterality: Right;     OB History    Gravida  2   Para  2   Term  2   Preterm      AB      Living  2     SAB      TAB      Ectopic      Multiple      Live Births              Family History  Problem Relation Age of Onset  . Heart attack Mother   . Heart disease Mother   . Kidney disease Mother   . Kidney disease Sister        on dialysis   . Hypertension Daughter   . Hypercholesterolemia Daughter   . Anxiety disorder Daughter   . Cancer Sister        lung  . COPD Sister   . Heart disease Sister   . Syncope episode Neg  Hx     Social History   Tobacco Use  . Smoking status: Former Smoker    Packs/day: 0.05    Years: 20.00    Pack years: 1.00    Types: Cigarettes    Quit date: 03/06/1993    Years since quitting: 26.3  . Smokeless tobacco: Current User    Types: Snuff  Substance Use Topics  . Alcohol use: No  . Drug use: No    Home Medications Prior to Admission medications   Medication Sig Start Date End Date Taking? Authorizing Provider  aspirin EC 81 MG tablet Take 81 mg by mouth daily.    [provider]  cephALEXin (KEFLEX) 500 MG capsule Take 1 capsule (500 mg total) by mouth 4 (four) times daily. Patient not taking: Reported on 07/15/2019 06/03/19   Kem Parkinson, PA-C  Cholecalciferol (VITAMIN D) 50 MCG (2000 UT) tablet Take 2,000 Units by mouth daily. 12/09/18   [provider]  Cholecalciferol (VITAMIN D3) 50000 units CAPS Take 1 capsule by mouth every 30  (thirty) days. Patient not taking: Reported on 07/15/2019 05/11/17   Raylene Everts, MD  furosemide (LASIX) 20 MG tablet Take 1 tablet (20 mg total) by mouth daily. 10/30/16   Raylene Everts, MD  gabapentin (NEURONTIN) 300 MG capsule TAKE 1 CAPSULE BY MOUTH THREE TIMES A DAY Patient not taking: Reported on 07/15/2019 06/15/17   Raylene Everts, MD  HYDROcodone-acetaminophen (NORCO/VICODIN) 5-325 MG tablet Take 1 tablet by mouth every 6 (six) hours as needed. 06/20/19   Dorie Rank, MD  ibuprofen (ADVIL,MOTRIN) 600 MG tablet Take 1 tablet (600 mg total) by mouth every 8 (eight) hours as needed. Arthritis pain Patient not taking: Reported on 07/15/2019 05/11/17   Raylene Everts, MD  levothyroxine (SYNTHROID, LEVOTHROID) 100 MCG tablet TAKE 1 TABLET BY MOUTH EVERY DAY 06/21/17   Raylene Everts, MD  losartan (COZAAR) 100 MG tablet TAKE 1 TABLET BY MOUTH EVERY DAY 06/21/17   Raylene Everts, MD  memantine [facility]) 10 MG tablet  06/03/18   [provider]    Allergies    Patient has no known allergies.  Review of Systems   Review of Systems  Constitutional: Negative for chills and fever.  Respiratory: Positive for cough.   Cardiovascular: Positive for leg swelling. Negative for chest pain.  Gastrointestinal: Positive for abdominal pain, constipation, nausea and vomiting.  All other systems reviewed and are negative.   Physical Exam Updated Vital Signs BP (!) 130/59   Pulse (!) 106   Temp 98.1 F (36.7 C) (Oral)   Resp 20   Ht 5\' 1"  (1.549 m)   Wt 59 kg   SpO2 100%   BMI 24.56 kg/m   Physical Exam Vitals and nursing note reviewed.  Constitutional:      General: She is not in acute distress.    Appearance: She is well-developed. She is ill-appearing.     Comments: Appears uncomfortable  HENT:     Head: Normocephalic and atraumatic.  Eyes:     General:        Right eye: No discharge.        Left eye: No discharge.     Conjunctiva/sclera: Conjunctivae normal.   Neck:     Vascular: No JVD.     Trachea: No tracheal deviation.  Cardiovascular:     Rate and Rhythm: Tachycardia present.  Pulmonary:     Comments: Tachypneic, globally diminished breath sounds, SPO2 saturations 94% on room air  Abdominal:     General: Bowel sounds are normal. There is distension.     Tenderness: There is abdominal tenderness in the right upper quadrant, epigastric area and left upper quadrant. There is guarding. There is no right CVA tenderness or left CVA tenderness.     Comments: Spitting up thin greenish secretions.  Skin:    Findings: No erythema.  Neurological:     Mental Status: She is alert.  Psychiatric:        Behavior: Behavior normal.     ED Results / Procedures / Treatments   Labs (all labs ordered are listed, but only abnormal results are displayed) Labs Reviewed  COMPREHENSIVE METABOLIC PANEL - Abnormal; Notable for the following components:      Result Value   Glucose, Bld 155 (*)    BUN 31 (*)    Creatinine, Ser 1.29 (*)    Albumin 2.5 (*)    GFR calc non Af Amer 37 (*)    GFR calc Af Amer 43 (*)    All other components within normal limits  CBC WITH DIFFERENTIAL/PLATELET - Abnormal; Notable for the following components:   WBC 16.4 (*)    Hemoglobin 10.8 (*)    HCT 35.2 (*)    Platelets 741 (*)    Neutro Abs 14.1 (*)    Monocytes Absolute 1.1 (*)    Abs Immature Granulocytes 0.34 (*)    All other components within normal limits  LACTIC ACID, PLASMA - Abnormal; Notable for the following components:   Lactic Acid, Venous 2.2 (*)    All other components within normal limits  CULTURE, BLOOD (ROUTINE X 2)  CULTURE, BLOOD (ROUTINE X 2)  SARS CORONAVIRUS 2 BY RT PCR (HOSPITAL ORDER, [facility])  LIPASE, BLOOD  BRAIN NATRIURETIC PEPTIDE  URINALYSIS, ROUTINE W REFLEX MICROSCOPIC  LACTIC ACID, PLASMA    EKG None  Radiology DG Chest Portable 1 View  Result Date: 07/09/2019 CLINICAL DATA:  Cough and  congestion, constipation EXAM: PORTABLE CHEST 1 VIEW COMPARISON:  06/03/2019, CT 07/15/2019 FINDINGS: Single-view chest demonstrates probable small pleural effusions. Basilar airspace disease. Stable cardiomediastinal silhouette with aortic atherosclerosis. Advanced arthropathy of the right shoulder. Lucency with possible meniscus level in the right upper quadrant. IMPRESSION: 1. Bilateral pleural effusions and basilar airspace disease. 2. Findings concerning for pneumoperitoneum beneath the right diaphragm. Critical Value/emergent results were called by telephone at the time of interpretation on 07/16/2019 at 11:46 pm to provider [facility] , who verbally acknowledged these results. Electronically Signed   By: Donavan Foil M.D.   On: 08/03/2019 23:46    Procedures .Critical Care Performed by: Renita Papa, PA-C Authorized by: Renita Papa, PA-C   Critical care provider statement:    Critical care time (minutes):  60   Critical care was necessary to treat or prevent imminent or life-threatening deterioration of the following conditions:  Sepsis   Critical care was time spent personally by me on the following activities:  Discussions with consultants, evaluation of patient's response to treatment, examination of patient, ordering and performing treatments and interventions, ordering and review of laboratory studies, ordering and review of radiographic studies, pulse oximetry, re-evaluation of patient's condition, obtaining history from patient or surrogate and review of old charts   (including critical care time)  Medications Ordered in ED Medications  piperacillin-tazobactam (ZOSYN) IVPB 3.375 g (has no administration in time range)  vancomycin (VANCOCIN) IVPB 1000 mg/200 mL premix (has no administration in time range)  sodium chloride 0.9 % bolus 500 mL (500 mLs Intravenous New Bag/Given 2019-08-07 0039)  fentaNYL (SUBLIMAZE) injection 50 mcg (50 mcg Intravenous Given 08/07/19 0035)  ondansetron  (ZOFRAN) injection 4 mg (4 mg Intravenous Given August 07, 2019 0035)    ED Course  I have reviewed the triage vital signs and the nursing notes.  Pertinent labs & imaging results that were available during my care of the patient were reviewed by me and considered in my medical decision making (see chart for details).    MDM Rules/Calculators/A&P                      Patient presenting for evaluation of constipation, cough.  She is afebrile, tachycardic in the ED.  Had a few episodes of emesis possibly containing stool.  Abdomen is firm and distended.  Will obtain lab work and chest x-ray for further evaluation.  Chest x-ray shows bilateral pleural effusions and bibasilar airspace disease as well as pneumoperitoneum beneath the right diaphragm.  This was not present on CT scans performed on 07/15/2019.  She started on IV vancomycin and IV Zosyn.  We will give IV fluids, antiemetics, pain medicine.  Will obtain blood cultures, lactic acid level as well.  12:11 AM CONSULT: Spoke with Dr. Constance Haw who reviewed images. Advises patient has a poor prognosis and will most likely not survive any surgical interventions.  She will touch base with the patient's daughter and discuss further plan of action.  12:32 AM Dr. Constance Haw had an extensive discussion with the patient's family regarding the patient's work-up and options.  They would like to proceed with comfort care.  They confirm that she is a DNR and the patient confirms this as well.  Family is unable to come in to see the patient tonight but they will plan to see her in the morning.    12:42 AM Discussed results and recommendation for palliative care with patient. She understands that she is critically ill and agrees with recommendation for palliative care. SpO2 saturations dropped to 84% on room air after fentanyl, place on 2L supplemental O2.   Lab work reviewed and interpreted by myself shows worsening leukocytosis, stable anemia, slightly worsening  renal insufficiency, no metabolic derangements.  Her lactic acid level is mildly elevated.  Spoke with Dr. Olevia Bowens with [facility] hospitalist service who agrees to assume care of patient and admit her to the hospital.  Patient was seen and evaluated Dr. Dayna Barker who agrees with assessment and plan at this time  Final Clinical Impression(s) / ED Diagnoses Final diagnoses:  Sepsis with acute hypoxic respiratory failure without septic shock, due to unspecified organism [facility])  Free intraperitoneal air    Rx / DC Orders ED Discharge Orders    None       Debroah Baller 08-07-19 0108    Merrily Pew, MD 07-Aug-2019 0144

## 2019-07-20 NOTE — ED Triage Notes (Signed)
Pt brought to ED via [facility] for constipation. Pt has taken miralax, mag citrate, and gas-x without relief.

## 2019-07-21 ENCOUNTER — Encounter (HOSPITAL_COMMUNITY): Payer: Self-pay | Admitting: Internal Medicine

## 2019-07-21 ENCOUNTER — Encounter (HOSPITAL_COMMUNITY): Payer: Self-pay | Admitting: *Deleted

## 2019-07-21 DIAGNOSIS — R198 Other specified symptoms and signs involving the digestive system and abdomen: Secondary | ICD-10-CM

## 2019-07-21 DIAGNOSIS — J9601 Acute respiratory failure with hypoxia: Secondary | ICD-10-CM | POA: Diagnosis present

## 2019-07-21 DIAGNOSIS — E869 Volume depletion, unspecified: Secondary | ICD-10-CM | POA: Diagnosis present

## 2019-07-21 DIAGNOSIS — J90 Pleural effusion, not elsewhere classified: Secondary | ICD-10-CM | POA: Diagnosis present

## 2019-07-21 DIAGNOSIS — N1832 Chronic kidney disease, stage 3b: Secondary | ICD-10-CM | POA: Diagnosis not present

## 2019-07-21 DIAGNOSIS — K631 Perforation of intestine (nontraumatic): Secondary | ICD-10-CM | POA: Diagnosis present

## 2019-07-21 DIAGNOSIS — K659 Peritonitis, unspecified: Secondary | ICD-10-CM | POA: Diagnosis present

## 2019-07-21 DIAGNOSIS — Z8249 Family history of ischemic heart disease and other diseases of the circulatory system: Secondary | ICD-10-CM | POA: Diagnosis not present

## 2019-07-21 DIAGNOSIS — K668 Other specified disorders of peritoneum: Secondary | ICD-10-CM

## 2019-07-21 DIAGNOSIS — J9600 Acute respiratory failure, unspecified whether with hypoxia or hypercapnia: Secondary | ICD-10-CM

## 2019-07-21 DIAGNOSIS — I129 Hypertensive chronic kidney disease with stage 1 through stage 4 chronic kidney disease, or unspecified chronic kidney disease: Secondary | ICD-10-CM | POA: Diagnosis present

## 2019-07-21 DIAGNOSIS — K5900 Constipation, unspecified: Secondary | ICD-10-CM | POA: Diagnosis present

## 2019-07-21 DIAGNOSIS — D631 Anemia in chronic kidney disease: Secondary | ICD-10-CM | POA: Diagnosis present

## 2019-07-21 DIAGNOSIS — R6520 Severe sepsis without septic shock: Secondary | ICD-10-CM | POA: Diagnosis present

## 2019-07-21 DIAGNOSIS — A419 Sepsis, unspecified organism: Principal | ICD-10-CM

## 2019-07-21 DIAGNOSIS — C786 Secondary malignant neoplasm of retroperitoneum and peritoneum: Secondary | ICD-10-CM

## 2019-07-21 DIAGNOSIS — R54 Age-related physical debility: Secondary | ICD-10-CM | POA: Diagnosis present

## 2019-07-21 DIAGNOSIS — Z7189 Other specified counseling: Secondary | ICD-10-CM

## 2019-07-21 DIAGNOSIS — C801 Malignant (primary) neoplasm, unspecified: Secondary | ICD-10-CM

## 2019-07-21 DIAGNOSIS — C55 Malignant neoplasm of uterus, part unspecified: Secondary | ICD-10-CM | POA: Diagnosis present

## 2019-07-21 DIAGNOSIS — E039 Hypothyroidism, unspecified: Secondary | ICD-10-CM | POA: Diagnosis present

## 2019-07-21 DIAGNOSIS — N183 Chronic kidney disease, stage 3 unspecified: Secondary | ICD-10-CM | POA: Diagnosis present

## 2019-07-21 DIAGNOSIS — Z515 Encounter for palliative care: Secondary | ICD-10-CM

## 2019-07-21 DIAGNOSIS — R109 Unspecified abdominal pain: Secondary | ICD-10-CM | POA: Diagnosis present

## 2019-07-21 DIAGNOSIS — N1831 Chronic kidney disease, stage 3a: Secondary | ICD-10-CM | POA: Diagnosis present

## 2019-07-21 DIAGNOSIS — Z20822 Contact with and (suspected) exposure to covid-19: Secondary | ICD-10-CM | POA: Diagnosis present

## 2019-07-21 DIAGNOSIS — Z96653 Presence of artificial knee joint, bilateral: Secondary | ICD-10-CM | POA: Diagnosis present

## 2019-07-21 DIAGNOSIS — R188 Other ascites: Secondary | ICD-10-CM | POA: Diagnosis present

## 2019-07-21 DIAGNOSIS — M810 Age-related osteoporosis without current pathological fracture: Secondary | ICD-10-CM | POA: Diagnosis present

## 2019-07-21 DIAGNOSIS — Z66 Do not resuscitate: Secondary | ICD-10-CM | POA: Diagnosis present

## 2019-07-21 DIAGNOSIS — Z841 Family history of disorders of kidney and ureter: Secondary | ICD-10-CM | POA: Diagnosis not present

## 2019-07-21 LAB — COMPREHENSIVE METABOLIC PANEL
ALT: 11 U/L (ref 0–44)
ALT: 9 U/L (ref 0–44)
AST: 16 U/L (ref 15–41)
AST: 20 U/L (ref 15–41)
Albumin: 2.2 g/dL — ABNORMAL LOW (ref 3.5–5.0)
Albumin: 2.5 g/dL — ABNORMAL LOW (ref 3.5–5.0)
Alkaline Phosphatase: 40 U/L (ref 38–126)
Alkaline Phosphatase: 46 U/L (ref 38–126)
Anion gap: 11 (ref 5–15)
Anion gap: 8 (ref 5–15)
BUN: 31 mg/dL — ABNORMAL HIGH (ref 8–23)
BUN: 32 mg/dL — ABNORMAL HIGH (ref 8–23)
CO2: 22 mmol/L (ref 22–32)
CO2: 24 mmol/L (ref 22–32)
Calcium: 10.3 mg/dL (ref 8.9–10.3)
Calcium: 9.8 mg/dL (ref 8.9–10.3)
Chloride: 102 mmol/L (ref 98–111)
Chloride: 104 mmol/L (ref 98–111)
Creatinine, Ser: 1.24 mg/dL — ABNORMAL HIGH (ref 0.44–1.00)
Creatinine, Ser: 1.29 mg/dL — ABNORMAL HIGH (ref 0.44–1.00)
GFR calc Af Amer: 43 mL/min — ABNORMAL LOW (ref >60)
GFR calc Af Amer: 45 mL/min — ABNORMAL LOW (ref >60)
GFR calc non Af Amer: 37 mL/min — ABNORMAL LOW (ref >60)
GFR calc non Af Amer: 39 mL/min — ABNORMAL LOW (ref >60)
Glucose, Bld: 141 mg/dL — ABNORMAL HIGH (ref 70–99)
Glucose, Bld: 155 mg/dL — ABNORMAL HIGH (ref 70–99)
Potassium: 3.5 mmol/L (ref 3.5–5.1)
Potassium: 4.3 mmol/L (ref 3.5–5.1)
Sodium: 135 mmol/L (ref 135–145)
Sodium: 136 mmol/L (ref 135–145)
Total Bilirubin: 0.5 mg/dL (ref 0.3–1.2)
Total Bilirubin: 0.8 mg/dL (ref 0.3–1.2)
Total Protein: 5.8 g/dL — ABNORMAL LOW (ref 6.5–8.1)
Total Protein: 7 g/dL (ref 6.5–8.1)

## 2019-07-21 LAB — CBC WITH DIFFERENTIAL/PLATELET
Abs Immature Granulocytes: 0.24 10*3/uL — ABNORMAL HIGH (ref 0.00–0.07)
Abs Immature Granulocytes: 0.34 10*3/uL — ABNORMAL HIGH (ref 0.00–0.07)
Basophils Absolute: 0.1 10*3/uL (ref 0.0–0.1)
Basophils Absolute: 0.1 10*3/uL (ref 0.0–0.1)
Basophils Relative: 0 %
Basophils Relative: 0 %
Eosinophils Absolute: 0.1 10*3/uL (ref 0.0–0.5)
Eosinophils Absolute: 0.1 10*3/uL (ref 0.0–0.5)
Eosinophils Relative: 0 %
Eosinophils Relative: 0 %
HCT: 33.5 % — ABNORMAL LOW (ref 36.0–46.0)
HCT: 35.2 % — ABNORMAL LOW (ref 36.0–46.0)
Hemoglobin: 10.1 g/dL — ABNORMAL LOW (ref 12.0–15.0)
Hemoglobin: 10.8 g/dL — ABNORMAL LOW (ref 12.0–15.0)
Immature Granulocytes: 1 %
Immature Granulocytes: 2 %
Lymphocytes Relative: 2 %
Lymphocytes Relative: 4 %
Lymphs Abs: 0.4 10*3/uL — ABNORMAL LOW (ref 0.7–4.0)
Lymphs Abs: 0.7 10*3/uL (ref 0.7–4.0)
MCH: 27.8 pg (ref 26.0–34.0)
MCH: 27.9 pg (ref 26.0–34.0)
MCHC: 30.1 g/dL (ref 30.0–36.0)
MCHC: 30.7 g/dL (ref 30.0–36.0)
MCV: 90.5 fL (ref 80.0–100.0)
MCV: 92.5 fL (ref 80.0–100.0)
Monocytes Absolute: 1.1 10*3/uL — ABNORMAL HIGH (ref 0.1–1.0)
Monocytes Absolute: 1.3 10*3/uL — ABNORMAL HIGH (ref 0.1–1.0)
Monocytes Relative: 7 %
Monocytes Relative: 8 %
Neutro Abs: 14.1 10*3/uL — ABNORMAL HIGH (ref 1.7–7.7)
Neutro Abs: 14.8 10*3/uL — ABNORMAL HIGH (ref 1.7–7.7)
Neutrophils Relative %: 87 %
Neutrophils Relative %: 89 %
Platelets: 624 10*3/uL — ABNORMAL HIGH (ref 150–400)
Platelets: 741 10*3/uL — ABNORMAL HIGH (ref 150–400)
RBC: 3.62 MIL/uL — ABNORMAL LOW (ref 3.87–5.11)
RBC: 3.89 MIL/uL (ref 3.87–5.11)
RDW: 14.1 % (ref 11.5–15.5)
RDW: 14.2 % (ref 11.5–15.5)
WBC: 16.4 10*3/uL — ABNORMAL HIGH (ref 4.0–10.5)
WBC: 16.9 10*3/uL — ABNORMAL HIGH (ref 4.0–10.5)
nRBC: 0 % (ref 0.0–0.2)
nRBC: 0 % (ref 0.0–0.2)

## 2019-07-21 LAB — LACTIC ACID, PLASMA
Lactic Acid, Venous: 2.1 mmol/L (ref 0.5–1.9)
Lactic Acid, Venous: 2.2 mmol/L (ref 0.5–1.9)

## 2019-07-21 LAB — BRAIN NATRIURETIC PEPTIDE: B Natriuretic Peptide: 33 pg/mL (ref 0.0–100.0)

## 2019-07-21 LAB — LIPASE, BLOOD: Lipase: 45 U/L (ref 11–51)

## 2019-07-21 LAB — SARS CORONAVIRUS 2 BY RT PCR (HOSPITAL ORDER, PERFORMED IN ~~LOC~~ HOSPITAL LAB): SARS Coronavirus 2: NEGATIVE

## 2019-07-21 MED ORDER — ONDANSETRON HCL 4 MG/2ML IJ SOLN: 4.0000 mg | Freq: Four times a day (QID) | INTRAMUSCULAR | Status: DC | PRN

## 2019-07-21 MED ORDER — PIPERACILLIN-TAZOBACTAM 3.375 G IVPB
3.3750 g | Freq: Three times a day (TID) | INTRAVENOUS | Status: DC
  Filled 2019-07-21: qty 50

## 2019-07-21 MED ORDER — METRONIDAZOLE IN NACL 5-0.79 MG/ML-% IV SOLN
500.0000 mg | Freq: Three times a day (TID) | INTRAVENOUS | Status: DC
  Administered 2019-07-21: 500 mg via INTRAVENOUS
  Filled 2019-07-21: qty 100

## 2019-07-21 MED ORDER — ACETAMINOPHEN 325 MG PO TABS: 650.0000 mg | ORAL_TABLET | Freq: Four times a day (QID) | ORAL | Status: DC | PRN

## 2019-07-21 MED ORDER — SODIUM CHLORIDE 0.9 % IV SOLN
2.0000 g | Freq: Once | INTRAVENOUS | Status: AC
  Administered 2019-07-21: 2 g via INTRAVENOUS
  Filled 2019-07-21: qty 2

## 2019-07-21 MED ORDER — HALOPERIDOL LACTATE 2 MG/ML PO CONC: 0.5000 mg | ORAL | Status: DC | PRN

## 2019-07-21 MED ORDER — CHLORHEXIDINE GLUCONATE 0.12 % MT SOLN
15.0000 mL | Freq: Two times a day (BID) | OROMUCOSAL | Status: DC
  Administered 2019-07-21: 15 mL via OROMUCOSAL
  Filled 2019-07-21: qty 15

## 2019-07-21 MED ORDER — HYDROMORPHONE HCL 1 MG/ML IJ SOLN
0.5000 mg | INTRAMUSCULAR | Status: DC
  Administered 2019-07-21 (×2): 0.5 mg via INTRAVENOUS
  Filled 2019-07-21 (×2): qty 0.5

## 2019-07-21 MED ORDER — ONDANSETRON HCL 4 MG/2ML IJ SOLN
4.0000 mg | Freq: Four times a day (QID) | INTRAMUSCULAR | Status: DC | PRN
  Administered 2019-07-21: 4 mg via INTRAVENOUS
  Filled 2019-07-21: qty 2

## 2019-07-21 MED ORDER — LORAZEPAM 2 MG/ML IJ SOLN: 1.0000 mg | INTRAMUSCULAR | Status: DC | PRN

## 2019-07-21 MED ORDER — POLYVINYL ALCOHOL 1.4 % OP SOLN: 1.0000 [drp] | Freq: Four times a day (QID) | OPHTHALMIC | Status: DC | PRN

## 2019-07-21 MED ORDER — GLYCOPYRROLATE 0.2 MG/ML IJ SOLN
0.2000 mg | INTRAMUSCULAR | Status: DC | PRN
  Administered 2019-07-21: 0.2 mg via INTRAVENOUS
  Filled 2019-07-21: qty 1

## 2019-07-21 MED ORDER — METOPROLOL TARTRATE 5 MG/5ML IV SOLN: 2.5000 mg | Freq: Four times a day (QID) | INTRAVENOUS | Status: DC | PRN

## 2019-07-21 MED ORDER — LORAZEPAM 2 MG/ML PO CONC: 1.0000 mg | ORAL | Status: DC | PRN

## 2019-07-21 MED ORDER — HALOPERIDOL LACTATE 5 MG/ML IJ SOLN: 0.5000 mg | INTRAMUSCULAR | Status: DC | PRN

## 2019-07-21 MED ORDER — HALOPERIDOL 0.5 MG PO TABS: 0.5000 mg | ORAL_TABLET | ORAL | Status: DC | PRN

## 2019-07-21 MED ORDER — ONDANSETRON HCL 4 MG/2ML IJ SOLN
4.0000 mg | Freq: Three times a day (TID) | INTRAMUSCULAR | Status: DC
  Administered 2019-07-21: 4 mg via INTRAVENOUS
  Filled 2019-07-21: qty 2

## 2019-07-21 MED ORDER — GLYCOPYRROLATE 0.2 MG/ML IJ SOLN: 0.2000 mg | INTRAMUSCULAR | Status: DC | PRN

## 2019-07-21 MED ORDER — LORAZEPAM 1 MG PO TABS: 1.0000 mg | ORAL_TABLET | ORAL | Status: DC | PRN

## 2019-07-21 MED ORDER — ONDANSETRON HCL 4 MG/2ML IJ SOLN
4.0000 mg | Freq: Once | INTRAMUSCULAR | Status: AC
  Administered 2019-07-21: 4 mg via INTRAVENOUS
  Filled 2019-07-21: qty 2

## 2019-07-21 MED ORDER — SODIUM CHLORIDE 0.9 % IV SOLN: 2.0000 g | INTRAVENOUS | Status: DC

## 2019-07-21 MED ORDER — PIPERACILLIN-TAZOBACTAM 3.375 G IVPB 30 MIN
3.3750 g | Freq: Once | INTRAVENOUS | Status: AC
  Administered 2019-07-21: 3.375 g via INTRAVENOUS

## 2019-07-21 MED ORDER — HYDROMORPHONE HCL 1 MG/ML IJ SOLN
0.5000 mg | INTRAMUSCULAR | Status: DC | PRN
  Administered 2019-07-21 (×2): 0.5 mg via INTRAVENOUS
  Filled 2019-07-21 (×2): qty 0.5

## 2019-07-21 MED ORDER — ACETAMINOPHEN 650 MG RE SUPP: 650.0000 mg | Freq: Four times a day (QID) | RECTAL | Status: DC | PRN

## 2019-07-21 MED ORDER — ORAL CARE MOUTH RINSE
15.0000 mL | Freq: Two times a day (BID) | OROMUCOSAL | Status: DC
  Administered 2019-07-21 (×2): 15 mL via OROMUCOSAL

## 2019-07-21 MED ORDER — GLYCOPYRROLATE 1 MG PO TABS: 1.0000 mg | ORAL_TABLET | ORAL | Status: DC | PRN

## 2019-07-21 MED ORDER — PROCHLORPERAZINE EDISYLATE 10 MG/2ML IJ SOLN: 10.0000 mg | INTRAMUSCULAR | Status: DC | PRN

## 2019-07-21 MED ORDER — VANCOMYCIN HCL 1000 MG IV SOLR
INTRAVENOUS | Status: AC
  Filled 2019-07-21: qty 1000

## 2019-07-21 MED ORDER — ONDANSETRON HCL 4 MG PO TABS: 4.0000 mg | ORAL_TABLET | Freq: Four times a day (QID) | ORAL | Status: DC | PRN

## 2019-07-21 MED ORDER — POTASSIUM CHLORIDE IN NACL 20-0.9 MEQ/L-% IV SOLN: INTRAVENOUS | Status: DC

## 2019-07-21 MED ORDER — VANCOMYCIN HCL IN DEXTROSE 1-5 GM/200ML-% IV SOLN
1000.0000 mg | Freq: Once | INTRAVENOUS | Status: AC
  Administered 2019-07-21: 1000 mg via INTRAVENOUS

## 2019-07-21 MED ORDER — VANCOMYCIN HCL 750 MG/150ML IV SOLN: 750.0000 mg | INTRAVENOUS | Status: DC

## 2019-07-21 MED ORDER — HYDROMORPHONE HCL 1 MG/ML IJ SOLN
0.5000 mg | INTRAMUSCULAR | Status: DC | PRN
  Administered 2019-07-21: 0.5 mg via INTRAVENOUS
  Filled 2019-07-21: qty 0.5

## 2019-07-21 MED ORDER — IOHEXOL 350 MG/ML SOLN
80.0000 mL | Freq: Once | INTRAVENOUS | Status: AC | PRN
  Administered 2019-07-21: 80 mL via INTRAVENOUS

## 2019-07-21 MED ORDER — ENOXAPARIN SODIUM 30 MG/0.3ML ~~LOC~~ SOLN
30.0000 mg | SUBCUTANEOUS | Status: DC
  Administered 2019-07-21: 30 mg via SUBCUTANEOUS
  Filled 2019-07-21: qty 0.3

## 2019-07-21 MED ORDER — ONDANSETRON 4 MG PO TBDP: 4.0000 mg | ORAL_TABLET | Freq: Four times a day (QID) | ORAL | Status: DC | PRN

## 2019-07-21 MED ORDER — BIOTENE DRY MOUTH MT LIQD: 15.0000 mL | OROMUCOSAL | Status: DC | PRN

## 2019-07-22 ENCOUNTER — Ambulatory Visit (HOSPITAL_COMMUNITY): Payer: PPO | Admitting: Hematology

## 2019-07-26 LAB — CULTURE, BLOOD (ROUTINE X 2)
Culture: NO GROWTH
Culture: NO GROWTH
Special Requests: ADEQUATE
Special Requests: ADEQUATE

## 2019-08-05 NOTE — Progress Notes (Signed)
Patient seen and examined.  Admitted after midnight secondary to abdominal pain and sepsis.  Underlying history of chronic kidney disease, hypothyroidism, anemia of chronic kidney disease, unspecified GYN malignancy and peritoneal carcinomatosis.  Work-up demonstrated Neumoperitoneum.  Ascites and pleural effusion; patient has met sepsis criteria on admission with elevated WBCs, elevated heart rate and elevated lactic acid in the setting of most likely intestinal rupture and peritonitis.  Case has been discussed in detail with family and consultants (general surgery, Dr. Constance Haw), and they feel patient is not a candidate for surgical intervention and best recommendation is to keep her comfortable.  Goals of care discussed with daughter at bedside who understand ongoing already decreasing quality of life and also high risk of intervention with no significant chances for changing outcome.  Decision was made not to pursuit invasive interventions and to keep patient comfortable.palliative care has been consulted to assist with symptomatic management; end-of-life protocol has been started, DVT prophylaxis, antibiotics and aggressive fluid resuscitation has been discontinued.  Will not pursued paracentesis or thoracentesis in the scenario.  Comfort feeding will be allowed.  Telemetry has been discontinued.  Plan: -Transition to full comfort -Symptomatic management only -End-of-life care. -Referral to hospice has been made.  Barton Dubois MD [phone]

## 2019-08-05 NOTE — Progress Notes (Signed)
Pharmacy Antibiotic Note  Jeanette Brady is a 86 y.o. female admitted on 07/31/2019 with intra-abdominal infection.  Pharmacy has been consulted for Vancomycin and cefepime dosing.  Plan: Vancomycin 1000mg  loading dose given, then 750mg  IV every 24 hours.  Goal trough 15-20 mcg/mL. Cefepime 2gm IV q24h Also on flagyl 500mg  IV q8h  F/U cxs and clinical progress Monitor V/S, labs and levels as indicated  Height: 5\' 1"  (154.9 cm) Weight: 64.7 kg (142 lb 10.2 oz) IBW/kg (Calculated) : 47.8  Temp (24hrs), Avg:98 F (36.7 C), Min:97.9 F (36.6 C), Max:98.1 F (36.7 C)  Recent Labs  Lab 07/15/19 0606 07/09/2019 2355 07/28/19 0207  WBC 14.1* 16.4* 16.9*  CREATININE 1.08* 1.29* 1.24*  LATICACIDVEN  --  2.2* 2.1*    Estimated Creatinine Clearance: 27 mL/min (A) (by C-G formula based on SCr of 1.24 mg/dL (H)).    No Known Allergies  Antimicrobials this admission: Cefepime 517  >>  Vancomycin 5/17>> Flagyl 5/17>>  Dose adjustments this admission: prn  Microbiology results: 5/17 BCx: pending  MRSA PCR:   Thank you for allowing pharmacy to be a part of this patient's care.  Isac Sarna, BS Pharm D, BCPS Clinical Pharmacist Pager [phone] Jul 28, 2019 9:22 AM

## 2019-08-05 NOTE — Progress Notes (Signed)
Pharmacy Antibiotic Note  Jeanette Brady is a 86 y.o. female admitted on 07/17/2019 with sepsis.  Pharmacy has been consulted to change Zosyn to cefepime.  Plan: Cefepime 2g IV Q24H.  Height: 5\' 1"  (154.9 cm) Weight: 64.7 kg (142 lb 10.2 oz) IBW/kg (Calculated) : 47.8  Temp (24hrs), Avg:98 F (36.7 C), Min:97.9 F (36.6 C), Max:98.1 F (36.7 C)  Recent Labs  Lab 07/15/19 0606 07/27/2019 2355 08-06-19 0207  WBC 14.1* 16.4* 16.9*  CREATININE 1.08* 1.29* 1.24*  LATICACIDVEN  --  2.2* 2.1*    Estimated Creatinine Clearance: 27 mL/min (A) (by C-G formula based on SCr of 1.24 mg/dL (H)).    No Known Allergies   Thank you for allowing pharmacy to be a part of this patient's care.  Wynona Neat, PharmD, BCPS  August 06, 2019 4:50 AM

## 2019-08-05 NOTE — Progress Notes (Signed)
I attempted to call patient today. Her phone did not have a VM.  I will try to contact her again before her appointment tomorrow.

## 2019-08-05 NOTE — TOC Progression Note (Signed)
Transition of Care [facility]) - Progression Note    Patient Details  Name: Jeanette Brady MRN: [id number] Date of Birth: Jul 11, 1929  Transition of [facility]) CM/SW Contact  Shade Flood, LCSW Phone Number: 08/16/2019, 3:23 PM  Clinical Narrative:     Received update from [place] at [facility] stating pt is approved for GIP status. Updated MD.  Expected Discharge Plan: [place] Barriers to Discharge: Hospice Bed not available  Expected Discharge Plan and Services Expected Discharge Plan: [place] In-house Referral: Clinical Social Work   Post Acute Care Choice: Hospice Living arrangements for the past 2 months: Single Family Home                                       Social Determinants of Health (SDOH) Interventions    Readmission Risk Interventions Readmission Risk Prevention Plan 08/16/2019  Transportation Screening Not Complete  Transportation Screening Comment pt comfort care  PCP or Specialist Appt within 5-7 Days Not Complete  Not Complete comments pt comfort care  Home Care Screening Not Complete  Home Care Screening Not Completed Comments Pt comfort care at this time  Medication Review (RN CM) Complete  Some recent data might be hidden

## 2019-08-05 NOTE — Consult Note (Signed)
Consultation Note Date: 08/07/2019   Patient Name: Jeanette Brady  DOB: February 05, 1930  MRN: [id number]  Age / Sex: 86 y.o., female  PCP: Celene Squibb, MD Referring Physician: Barton Dubois, MD  Reason for Consultation: Establishing goals of care and Terminal Care  HPI/Patient Profile: 86 y.o. 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 She was  admitted on 07/18/2019 after presenting to ER with severe constipation and abdominal pain. Workup revealed viscous perforation and pneumoperitoneum. Surgery was consulted- patient with poor prognosis. Discussion was had with family and she was transitioned to comfort measures only and Hospice house placement was requested.    Clinical Assessment and Goals of Care: I reviewed the patient's chart and examined the patient.  She is complaining of sharp pain in her back and she is also nauseated.  Her Granddaughter, Darnelle Bos, is at bedside.  Darnelle Bos tells me that all family members understand that this is a terminal event for Ms. Ngo and the primary goal is for her to be as comfortable as possible as she transitions into death. Daphne notes that hospice had called the room, but they need to reach her aunt- who is patient's sister on her cell phone.   Primary Decision Maker NEXT OF KIN- patient's daughter    SUMMARY OF RECOMMENDATIONS -Will schedule pain medication- hydromorphone .5mg  q2 hrs IV as well as have .5mg  IV hydromorphone prn q49mins as needed for breakthrough pain -Will also schedule nausea medication ondansetron 4mg  IV q8hr and have compazine 10mg  IV q4hr prn available as well, encourage use of lorazepam for nausea and pain also    Code Status/Advance  Care Planning:  DNR   Palliative Prophylaxis:   Frequent Pain Assessment  Additional Recommendations (Limitations, Scope, Preferences):  Full Comfort Care  Psycho-social/Spiritual:   Desire for further Chaplaincy support:yes   Prognosis:    Hours - Days  Discharge Planning: Hospice facility  Primary Diagnoses: Present on Admission: . Sepsis due to undetermined organism with acute respiratory failure ([place]) . Hypertension . Hypothyroidism . Peritoneal carcinomatosis ([place]) . Normocytic anemia . Volume depletion . CKD (chronic kidney disease), stage III . Hypercalcemia   I have reviewed the medical record, interviewed the patient and family, and examined the patient. The following aspects are pertinent.  Past Medical History:  Diagnosis Date  . Anemia   . Arthritis   . Cataract   . Chronic kidney disease   . Gynecologic malignancy ([place]) 07/01/2019  . Heart murmur   . Hypertension   . 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 Thyroid disease    Social History   Socioeconomic History  . Marital status: Married    Spouse name: Not on file  . Number of children: Not on file  . Years of education: Not on file  . Highest education level: Not on file  Occupational History  . Not on file  Tobacco Use  . Smoking status: Former Smoker    Packs/day: 0.05    Years: 20.00    Pack years: 1.00    Types: Cigarettes    Quit date: 03/06/1993    Years since quitting: 26.3  . Smokeless tobacco: Current User    Types: Snuff  Substance and Sexual Activity  . Alcohol use: No  . Drug use: No  . Sexual activity: Not Currently  Other Topics Concern  . Not on file  Social History Narrative  . Not on file   Social Determinants of Health   Financial Resource Strain:   . Difficulty of Paying Living Expenses:   Food Insecurity:   . Worried About Charity fundraiser in the Last Year:   . Arboriculturist in the Last Year:   Transportation Needs:   . Film/video editor  (Medical):   Marland Kitchen Lack of Transportation (Non-Medical):   Physical Activity:   . Days of Exercise per Week:   . Minutes of Exercise per Session:   Stress:   . Feeling of Stress :   Social Connections:   . Frequency of Communication with Friends and Family:   . Frequency of Social Gatherings with Friends and Family:   . Attends Religious Services:   . Active Member of Clubs or Organizations:   . Attends Archivist Meetings:   Marland Kitchen Marital Status:    Family History  Problem Relation Age of Onset  . Heart attack Mother   . Heart disease Mother   . Kidney disease Mother   . Kidney disease Sister        on dialysis   . Hypertension Daughter   . Hypercholesterolemia Daughter   . Anxiety disorder Daughter   . Cancer Sister        lung  . COPD Sister   . Heart disease Sister   . Syncope episode Neg Hx    Scheduled Meds: . chlorhexidine  15 mL Mouth Rinse BID  .  HYDROmorphone (DILAUDID) injection  0.5 mg Intravenous Q2H  . mouth rinse  15 mL Mouth Rinse q12n4p   Continuous Infusions: . 0.9 % NaCl with KCl 20 mEq / L 10 mL/hr at August 10, 2019 1158   PRN Meds:.acetaminophen **OR** acetaminophen, antiseptic oral rinse, glycopyrrolate **OR** glycopyrrolate **OR** glycopyrrolate, haloperidol **OR** [DISCONTINUED] haloperidol **OR** haloperidol lactate, HYDROmorphone (DILAUDID) injection, LORazepam **OR** [DISCONTINUED] LORazepam **OR** LORazepam, metoprolol tartrate, ondansetron **OR** ondansetron (ZOFRAN) IV, polyvinyl alcohol Medications Prior to Admission:  Prior to Admission medications   Medication Sig Start Date End Date Taking? Authorizing Provider  aspirin EC 81 MG tablet Take 81 mg by mouth daily.    [provider]  cephALEXin (KEFLEX) 500 MG capsule Take 1 capsule (500 mg total) by mouth 4 (four) times daily. Patient not taking: Reported on 07/15/2019 06/03/19   Kem Parkinson, PA-C  Cholecalciferol (VITAMIN D) 50 MCG (2000 UT) tablet Take 2,000 Units by mouth  daily. 12/09/18   [provider]  Cholecalciferol (VITAMIN D3) 50000 units CAPS Take 1 capsule by mouth every 30 (thirty) days. Patient not taking: Reported on 07/15/2019 05/11/17   Raylene Everts, MD  furosemide (LASIX) 20 MG tablet Take 1 tablet (20 mg total) by mouth daily. 10/30/16   Raylene Everts, MD  gabapentin (NEURONTIN) 300 MG capsule TAKE 1 CAPSULE BY MOUTH THREE TIMES A DAY Patient not taking: Reported on 07/15/2019 06/15/17   Raylene Everts, MD  HYDROcodone-acetaminophen (NORCO/VICODIN) 5-325 MG tablet Take 1 tablet  by mouth every 6 (six) hours as needed. 06/20/19   Dorie Rank, MD  ibuprofen (ADVIL,MOTRIN) 600 MG tablet Take 1 tablet (600 mg total) by mouth every 8 (eight) hours as needed. Arthritis pain Patient not taking: Reported on 07/15/2019 05/11/17   Raylene Everts, MD  levothyroxine (SYNTHROID, LEVOTHROID) 100 MCG tablet TAKE 1 TABLET BY MOUTH EVERY DAY 06/21/17   Raylene Everts, MD  losartan (COZAAR) 100 MG tablet TAKE 1 TABLET BY MOUTH EVERY DAY 06/21/17   Raylene Everts, MD  memantine [facility]) 10 MG tablet  06/03/18   [provider]   No Known Allergies Review of Systems  Unable to perform ROS: Acuity of condition    Physical Exam Vitals and nursing note reviewed.  Constitutional:      Appearance: She is ill-appearing.  Cardiovascular:     Rate and Rhythm: Tachycardia present.     Pulses: Normal pulses.  Abdominal:     General: There is distension.     Tenderness: There is abdominal tenderness.  Skin:    General: Skin is warm and dry.     Coloration: Skin is pale.  Neurological:     General: No focal deficit present.     Mental Status: She is alert.     Vital Signs: BP (!) 118/93 (BP Location: Right Arm)   Pulse (!) 109   Temp (!) 97 F (36.1 C)   Resp 18   Ht 5\' 1"  (1.549 m)   Wt 64.7 kg   SpO2 97%   BMI 26.95 kg/m  Pain Scale: Faces   Pain Score: Asleep   SpO2: SpO2: 97 % O2 Device:SpO2: 97 % O2 Flow Rate:  .O2 Flow Rate (L/min): 2 L/min  IO: Intake/output summary:   Intake/Output Summary (Last 24 hours) at 06-Aug-2019 1330 Last data filed at 06-Aug-2019 0154 Gross per 24 hour  Intake 550 ml  Output --  Net 550 ml    LBM: Last BM Date: 07/17/19 Baseline Weight: Weight: 59 kg Most recent weight: Weight: 64.7 kg     Palliative Assessment/Data: PPS: 10%     Thank you for this consult. Palliative medicine will continue to follow and assist as needed.   Time In: 1330 Time Out: 1445 Time Total: 75 minutes Greater than 50%  of this time was spent counseling and coordinating care related to the above assessment and plan.  Signed by: Mariana Kaufman, AGNP-C Palliative Medicine    Please contact Palliative Medicine Team phone at [phone] for questions and concerns.  For individual provider: See Shea Evans

## 2019-08-05 NOTE — ED Notes (Signed)
Date and time results received: 08-01-19 0047 (use smartphrase ".now" to insert current time)  Test: lactic Critical Value: 2.2  Name of Provider Notified: Raul Del PA  Orders Received? Or Actions Taken?: Orders Received - See Orders for details

## 2019-08-05 NOTE — H&P (Signed)
History and Physical    Jeanette Brady [id number] DOB: 26-Jul-1929 DOA: 07/11/2019  PCP: Celene Squibb, MD   Patient coming from: Home.  I have personally briefly reviewed patient's old medical records in [place]  Chief Complaint: Abdominal pain.  HPI: Jeanette Brady is a 86 y.o. female with medical history significant of anemia, osteoarthritis, cataract, chronic kidney disease, heart murmur, hypertension, osteoporosis, hypothyroidism, recently diagnosed with an unspecified GYN malignancy and peritoneal carcinomatosis is coming to the emergency department due to abdominal pain and constipation for 9 days despite taking MiraLAX, magnesium citrate and Gas-X without relief.  He denies fever, chills, rhinorrhea, sore throat or cough.  She denies nausea, vomiting, diarrhea, melena or hematochezia.  No dysuria, frequency or hematuria.  Denies chest pain, palpitations, diaphoresis, orthopnea or pitting edema of the lower extremities.  No polyuria, polydipsia, polyphagia or blurred vision.  ED Course: Initial vital signs temperature 98.1 F, pulse 114, respirations 24, blood pressure 126/91 mmHg and O2 sat 94% on room air. In the ED, the patient received a 500 mL NS bolus, fentanyl 50 mcg IVP x1 dose, Zofran 4 mg IVP, vancomycin and Zosyn.  Her CBC showed a white count of 16.4 with 87% neutrophils, hemoglobin 10.8 g/dL and platelets 741.  Lipase was normal.  CMP showed normal electrolytes, except for corrected calcium of 11.5 mg/dL.  Albumin was 2.5 g/dL, the rest of the LFTs were within normal limits. Portable chest radiograph shows bilateral pleural effusions and basilar airspace disease.  Findings concerning for pneumoperitoneum beneath the right hemidiaphragm.  Review of Systems: As per HPI otherwise all other systems reviewed and are negative.  Past Medical History:  Diagnosis Date  . Anemia   . Arthritis   . Cataract   . Chronic kidney disease   . Gynecologic malignancy ([place])  07/01/2019  . Heart murmur   . Hypertension   . 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 Thyroid disease     Past Surgical History:  Procedure Laterality Date  . BACK SURGERY    . EYE SURGERY    . JOINT REPLACEMENT    . REPLACEMENT TOTAL KNEE BILATERAL    . SPINE SURGERY    . YAG LASER APPLICATION Right [id number]   Procedure: YAG LASER APPLICATION;  Surgeon: Rutherford Guys, MD;  Location: [facility];  Service: Ophthalmology;  Laterality: Right;    Social History  reports that she quit smoking about 26 years ago. Her smoking use included cigarettes. She has a 1.00 pack-year smoking history. Her smokeless tobacco use includes snuff. She reports that she does not drink alcohol or use drugs.  No Known Allergies  Family History  Problem Relation Age of Onset  . Heart attack Mother   . Heart disease Mother   . Kidney disease Mother   . Kidney disease Sister        on dialysis   . Hypertension Daughter   . Hypercholesterolemia Daughter   . Anxiety disorder Daughter   . Cancer Sister        lung  . COPD Sister   . Heart disease Sister   . Syncope episode Neg Hx    Prior to Admission medications   Medication Sig Start Date End Date Taking? Authorizing Provider  aspirin EC 81 MG tablet Take 81 mg by mouth daily.    [provider]  cephALEXin (KEFLEX) 500 MG capsule Take 1 capsule (500 mg total) by mouth 4 (four) times daily. Patient not taking: Reported on 07/15/2019  06/03/19   Triplett, Tammy, PA-C  Cholecalciferol (VITAMIN D) 50 MCG (2000 UT) tablet Take 2,000 Units by mouth daily. 12/09/18   [provider]  Cholecalciferol (VITAMIN D3) 50000 units CAPS Take 1 capsule by mouth every 30 (thirty) days. Patient not taking: Reported on 07/15/2019 05/11/17   Raylene Everts, MD  furosemide (LASIX) 20 MG tablet Take 1 tablet (20 mg total) by mouth daily. 10/30/16   Raylene Everts, MD  gabapentin (NEURONTIN) 300 MG capsule TAKE 1 CAPSULE BY MOUTH THREE TIMES A DAY Patient  not taking: Reported on 07/15/2019 06/15/17   Raylene Everts, MD  HYDROcodone-acetaminophen (NORCO/VICODIN) 5-325 MG tablet Take 1 tablet by mouth every 6 (six) hours as needed. 06/20/19   Dorie Rank, MD  ibuprofen (ADVIL,MOTRIN) 600 MG tablet Take 1 tablet (600 mg total) by mouth every 8 (eight) hours as needed. Arthritis pain Patient not taking: Reported on 07/15/2019 05/11/17   Raylene Everts, MD  levothyroxine (SYNTHROID, LEVOTHROID) 100 MCG tablet TAKE 1 TABLET BY MOUTH EVERY DAY 06/21/17   Raylene Everts, MD  losartan (COZAAR) 100 MG tablet TAKE 1 TABLET BY MOUTH EVERY DAY 06/21/17   Raylene Everts, MD  memantine [facility]) 10 MG tablet  06/03/18   [provider]    Physical Exam: Vitals:   2019/07/27 0042 2019-07-27 0043 27-Jul-2019 0044 07/27/19 0045  BP:      Pulse: (!) 107 (!) 107 (!) 107 (!) 106  Resp: (!) 21 18 18 20   Temp:      TempSrc:      SpO2: 99% 100% 100% 100%  Weight:      Height:        Constitutional: Frail, elderly female. Eyes: PERRL, lids and conjunctivae normal ENMT: Mucous membranes are mildly dry. Posterior pharynx clear of any exudate or lesions. Neck: normal, supple, no masses, no thyromegaly Respiratory: Decreased breath sounds on bases, no wheezing, no crackles. Normal respiratory effort. No accessory muscle use.  Cardiovascular: Regular rate and rhythm, no murmurs / rubs / gallops. No extremity edema. 2+ pedal pulses. No carotid bruits.  Abdomen: Distended.  Positive ascites.  BS positive.  Soft, positive diffuse tenderness, particularly on upper quadrants, no guarding, no rebound, no masses palpated. No hepatosplenomegaly. Musculoskeletal: no clubbing / cyanosis. Good ROM, no contractures. Normal muscle tone.  Skin: no abuse rashes, lesions, ulcers on very limited dermatological examination. Neurologic: CN 2-12 grossly intact. Sensation intact, DTR normal. Strength 5/5 in all 4.  Psychiatric: Normal judgment and insight. Alert and oriented x  3. Normal mood.   Labs on Admission: I have personally reviewed following labs and imaging studies  CBC: Recent Labs  Lab 07/15/19 0606 07/10/2019 2355  WBC 14.1* 16.4*  NEUTROABS 11.9* 14.1*  HGB 10.8* 10.8*  HCT 34.4* 35.2*  MCV 90.1 90.5  PLT 641* 741*    Basic Metabolic Panel: Recent Labs  Lab 07/15/19 0606 07/14/2019 2355  NA 140 135  K 3.6 3.5  CL 105 102  CO2 23 22  GLUCOSE 110* 155*  BUN 23 31*  CREATININE 1.08* 1.29*  CALCIUM 9.8 10.3    GFR: Estimated Creatinine Clearance: 24.9 mL/min (A) (by C-G formula based on SCr of 1.29 mg/dL (H)).  Liver Function Tests: Recent Labs  Lab 07/15/19 0606 08/03/2019 2355  AST 12* 20  ALT 8 11  ALKPHOS 39 46  BILITOT 0.7 0.5  PROT 6.2* 7.0  ALBUMIN 2.4* 2.5*    Urine analysis:    Component Value Date/Time  COLORURINE YELLOW 07/15/2019 [place] 07/15/2019 1029   LABSPEC 1.010 07/15/2019 1029   PHURINE 6.0 07/15/2019 1029   GLUCOSEU NEGATIVE 07/15/2019 1029   HGBUR NEGATIVE 07/15/2019 [place] 07/15/2019 1029   KETONESUR TRACE (A) 07/15/2019 1029   PROTEINUR 30 (A) 07/15/2019 1029   UROBILINOGEN 0.2 10/31/2014 1143   NITRITE NEGATIVE 07/15/2019 [place] 07/15/2019 1029   Radiological Exams on Admission: DG Chest Portable 1 View  Result Date: 07/30/2019 CLINICAL DATA:  Cough and congestion, constipation EXAM: PORTABLE CHEST 1 VIEW COMPARISON:  06/03/2019, CT 07/15/2019 FINDINGS: Single-view chest demonstrates probable small pleural effusions. Basilar airspace disease. Stable cardiomediastinal silhouette with aortic atherosclerosis. Advanced arthropathy of the right shoulder. Lucency with possible meniscus level in the right upper quadrant. IMPRESSION: 1. Bilateral pleural effusions and basilar airspace disease. 2. Findings concerning for pneumoperitoneum beneath the right diaphragm. Critical Value/emergent results were called by telephone at the time of  interpretation on 07/07/2019 at 11:46 pm to provider [facility] , who verbally acknowledged these results. Electronically Signed   By: Donavan Foil M.D.   On: 07/10/2019 23:46   EKG: Independently reviewed. Vent. rate 104 BPM PR interval * ms QRS duration 88 ms QT/QTc 340/448 ms P-R-T axes 48 -2 33 Sinus tachycardia Left atrial enlargement Probable left ventricular hypertrophy No major changes seen when compared to previous tracings  Assessment/Plan Principal Problem:   Sepsis due to undetermined organism with acute respiratory failure ([place]) I suspect that the hypoxia may be in part restrictive given pleural effusions. However, there is a possibility of ongoing airspace disease. Continue vancomycin per pharmacy. Add cefepime and metronidazole. Follow-up lactic acid level. Follow-up CBC and CMP. Follow-up blood culture and sensitivity.  Active Problems:   Peritoneal carcinomatosis ([place]) With likely perforation given right infra diaphragmatic free air. Analgesics as needed. Broad-spectrum antibiotic coverage. May need therapeutic paracentesis. Consult general surgery. Consult palliative care.    Volume depletion Gentle IV hydration.    CKD (chronic kidney disease), stage III Follow-up renal function electrolytes.    Hypercalcemia Corrected calcium is 11.5 mg/dL. The patient does not have any mental status changes. Discontinue vitamin D. Continue IV fluids. Low-dose furosemide as needed. Follow-up albumin and calcium level.    Hypertension Hold oral furosemide and losartan. Monitor blood pressure. As needed metoprolol as needed.    Hypothyroidism Continue Synthroid 50 mcg IVP daily.    Normocytic anemia Monitor H&H.    DVT prophylaxis: Lovenox SQ. Code Status:   Full code. Family Communication:   Disposition Plan:   Patient is from:  Home.  Anticipated DC to:  To be determined.  Anticipated DC date:  07/24/2019.  Anticipated DC barriers: Clinical  improvement.  Consultants evaluation.  Consults called:  Routine general surgery and palliative medicine consults. Admission status:  Inpatient/telemetry.  Severity of Illness:  Reubin Milan MD [facility]  How to contact the [facility] Attending or Consulting provider [place] or covering provider during after hours [place], for this patient?   1. Check the care team in [facility] and look for a) attending/consulting TR[facility] provider listed and b) the [facility] team listed 2. Log into [email or web address] and use [place]'s universal password to access. If you do not have the password, please contact the hospital operator. 3. Locate the [facility] provider you are looking for under [facility] and page to a number that you can be directly reached. 4. If you still have difficulty reaching the provider, please  page the [facility] (Director on Call) for the Hospitalists listed on amion for assistance.  08/08/19, 1:12 AM   This document was prepared using Dragon voice recognition software and may contain some unintended transcription errors.

## 2019-08-05 NOTE — Progress Notes (Signed)
Nutrition Brief Note  Chart reviewed. Patient presents with abdominal pain. Hx of  GYN malignancy and peritoneal carcinomatosis. She is not a candidate for surgery per MD.  Pt now transitioning to comfort care.  No further nutrition interventions warranted at this time.  Please re-consult as needed.   Colman Cater MS,RD,CSG,LDN Contact: pager #- Shea Evans

## 2019-08-05 NOTE — TOC Initial Note (Signed)
Transition of Care [facility]) - Initial/Assessment Note    Patient Details  Name: Jeanette Brady MRN: [id number] Date of Birth: 02/03/1930  [facility]) CM/SW Contact:    Shade Flood, LCSW Phone Number: 08-08-19, 11:59 AM  Clinical Narrative:                  Pt admitted from home. Per MD, pt and family wanting comfort care and hospice referral. Referral given to Cassandra at [facility]. Per Cassandra, RN will come this afternoon to evaluate pt. TOC will follow and assist as needed.  Expected Discharge Plan: [place] Barriers to Discharge: Hospice Bed not available   Patient Goals and CMS Choice        Expected Discharge Plan and Services Expected Discharge Plan: [place] In-house Referral: Clinical Social Work   Post Acute Care Choice: Hospice Living arrangements for the past 2 months: [place]                                      Prior Living Arrangements/Services Living arrangements for the past 2 months: Single Family Home   Patient language and need for interpreter reviewed:: Yes        Need for Family Participation in Patient Care: Yes (Comment) Care giver support system in place?: Yes (comment)   Criminal Activity/Legal Involvement Pertinent to Current Situation/Hospitalization: No - Comment as needed  Activities of Daily Living Home Assistive Devices/Equipment: Wheelchair, Environmental consultant (specify type), Shower chair with back, Eyeglasses ADL Screening (condition at time of admission) Patient's cognitive ability adequate to safely complete daily activities?: No Is the patient deaf or have difficulty hearing?: No Does the patient have difficulty seeing, even when wearing glasses/contacts?: Yes Does the patient have difficulty concentrating, remembering, or making decisions?: Yes Patient able to express need for assistance with ADLs?: Yes Does the patient have difficulty dressing or bathing?:  Yes Independently performs ADLs?: No Communication: Independent Dressing (OT): Dependent Is this a change from baseline?: Pre-admission baseline Grooming: Dependent Is this a change from baseline?: Pre-admission baseline Feeding: Needs assistance Is this a change from baseline?: Pre-admission baseline Bathing: Dependent Is this a change from baseline?: Pre-admission baseline Toileting: Dependent Is this a change from baseline?: Pre-admission baseline In/Out Bed: Dependent Is this a change from baseline?: Pre-admission baseline Walks in Home: Dependent Is this a change from baseline?: Pre-admission baseline Does the patient have difficulty walking or climbing stairs?: Yes Weakness of Legs: Both Weakness of Arms/Hands: Both  Permission Sought/Granted Permission sought to share information with : Chartered certified accountant granted to share information with : Yes, Verbal Permission Granted     Permission granted to share info w AGENCY: Hospice        Emotional Assessment       Orientation: : Oriented to Self, Oriented to Place, Oriented to Situation Alcohol / Substance Use: Not Applicable Psych Involvement: No (comment)  Admission diagnosis:  Sepsis due to undetermined organism with acute respiratory failure ([place]) [A41.9, R65.20, J96.00] Free intraperitoneal air [K66.8] Sepsis with acute hypoxic respiratory failure without septic shock, due to unspecified organism ([place]) [A41.9, R65.20, J96.01] Patient Active Problem List   Diagnosis Date Noted  . Sepsis due to undetermined organism with acute respiratory failure ([place]) 2019/08/08  . Volume depletion 2019-08-08  . CKD (chronic kidney disease), stage III 08-08-2019  . Hypercalcemia 08-08-2019  . Gynecologic malignancy ([place]) 07/01/2019  .  Peritoneal carcinomatosis ([place]) 07/01/2019  . Intention tremor 05/01/2016  . 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 Primary osteoarthritis involving multiple joints  01/25/2016  . Personal history of benign brain tumor 01/25/2016  . Syncope 10/15/2015  . Hypertension 10/15/2015  . Hypothyroidism 10/15/2015  . Heart murmur 10/15/2015  . Kidney disease 10/15/2015  . Normocytic anemia 10/15/2015  . SHOULDER PAIN 06/09/2008   PCP:  Celene Squibb, MD Pharmacy:   [facility] - [place], [place] - [address] [place] [address] Phone: [phone] Fax: [phone]     Social Determinants of Health ([place]) Interventions    Readmission Risk Interventions Readmission Risk Prevention Plan 07-30-19  Transportation Screening Not Complete  Transportation Screening Comment pt comfort care  PCP or Specialist Appt within 5-7 Days Not Complete  Not Complete comments pt comfort care  Home Care Screening Not Complete  Home Care Screening Not Completed Comments Pt comfort care at this time  Medication Review (RN CM) Complete  Some recent data might be hidden

## 2019-08-05 NOTE — Discharge Summary (Signed)
Death Summary  Jeanette Brady [id number] DOB: 09-11-1929 DOA: 08-08-19  PCP: Celene Squibb, MD PCP/Office notified: PCP office communicated through epic.  Admit date: 08-08-19 Date of Death: August 10, 2019  Final Diagnoses:  Principal Problem:   Sepsis due to undetermined organism with acute respiratory failure ([place]) Active Problems:   Hypertension   Hypothyroidism   Normocytic anemia   Peritoneal carcinomatosis ([facility])   Volume depletion   CKD (chronic kidney disease), stage III   Hypercalcemia   Free intraperitoneal air   Sepsis with acute hypoxic respiratory failure without septic shock ([facility])   Perforation of viscus   Comfort measures only status   Palliative care by specialist   Advanced care planning/counseling discussion   History of present illness:  86 y.o. female with medical history significant of anemia, osteoarthritis, cataract, chronic kidney disease, heart murmur, hypertension, osteoporosis, hypothyroidism, recently diagnosed with an unspecified GYN malignancy and peritoneal carcinomatosis is coming to the emergency department due to abdominal pain and constipation for 9 days despite taking MiraLAX, magnesium citrate and Gas-X without relief.  He denies fever, chills, rhinorrhea, sore throat or cough.  She denies nausea, vomiting, diarrhea, melena or hematochezia.  No dysuria, frequency or hematuria.  Denies chest pain, palpitations, diaphoresis, orthopnea or pitting edema of the lower extremities.  No polyuria, polydipsia, polyphagia or blurred vision.  ED Course: Initial vital signs temperature 98.1 F, pulse 114, respirations 24, blood pressure 126/91 mmHg and O2 sat 94% on room air. In the ED, the patient received a 500 mL NS bolus, fentanyl 50 mcg IVP x1 dose, Zofran 4 mg IVP, vancomycin and Zosyn.  Her CBC showed a white count of 16.4 with 87% neutrophils, hemoglobin 10.8 g/dL and platelets 741.  Lipase was normal.  CMP showed normal electrolytes, except for  corrected calcium of 11.5 mg/dL.  Albumin was 2.5 g/dL, the rest of the LFTs were within normal limits. Portable chest radiograph shows bilateral pleural effusions and basilar airspace disease.  Findings concerning for pneumoperitoneum beneath the right hemidiaphragm.  Hospital Course:  1-sepsis due to undetermined organism with pneumoperitoneum, peritoneal carcinomatosis, ascites and pleural effusion. -Patient met sepsis criteria on presentation (elevated liver disease, elevated lactic acid, increased respiratory rate, tachycardia and abnormal CT abdomen for identified source of infection) -Discussed with general Code palpation not candidate for surgical intervention and essentially at death sentence and terminal condition presentation. -Long discussion for goals of care and advance directives with family member at bedside who understood condition and requested to keep patient comfortable. -End-of-life protocol initiated, initial broad-spectrum antibiotics and IV fluids discontinued. -Analgesics, antiemetics and medication to assist with agitation provided. -At 6:48 PM patient expired comfortable and with family at bedside.  2-hypercalcemia -Calcium 11.5 -In the setting of malignancy -IV fluids given initially -Not tolerating p.o.'s -As mentioned above decision made to keep patient comfortable only -No further blood work pursued.  3-hypertension -All antihypertensive agents were discontinued -Plan of care was comfort only.  4-chronic kidney disease a stage III -Creatinine appears to be at baseline -Goals of care was just comfort no further blood work to be done.  5-GYN malignancy and peritoneal carcinomatosis -Terminal -As mentioned above comfort care only was provided.  6-hypothyroidism -With initial plan to continue Synthroid intravenously while unable to take p.o.; after discussion of goals of care all nonessential medications for symptomatic management were  discontinued.     Time: 25 minutes  Signed:  Barton Dubois  [facility] 2019/08/10, 1:30 PM

## 2019-08-05 NOTE — Progress Notes (Signed)
Pharmacy Antibiotic Note  Jeanette Brady is a 86 y.o. female admitted on 08/03/2019 with sepsis.  Pharmacy has been consulted for vancomycin and zosyn dosing.  Plan: Zosyn 3.375g IV q8h (4 hour infusion).  Vancomycin 1gm IV x 1 F/u renal function for further dosing F/u cultures and clinical course  Height: 5\' 1"  (154.9 cm) Weight: 59 kg (130 lb) IBW/kg (Calculated) : 47.8  Temp (24hrs), Avg:98.1 F (36.7 C), Min:98.1 F (36.7 C), Max:98.1 F (36.7 C)  Recent Labs  Lab 07/15/19 0606  WBC 14.1*  CREATININE 1.08*    Estimated Creatinine Clearance: 29.7 mL/min (A) (by C-G formula based on SCr of 1.08 mg/dL (H)).    No Known Allergies  Thank you for allowing pharmacy to be a part of this patient's care.  [name] 08/05/19 12:11 AM

## 2019-08-05 NOTE — Progress Notes (Signed)
I did not realize patient was currently admitted.  She has transitioned to comfort care and no further navigation is needed at this time.

## 2019-08-05 NOTE — Progress Notes (Signed)
[facility]  Discussed with daughter, Vidal Schwalbe, [phone] that patient has very poor prognosis and is unlikely to survive surgery and would have significant complications given her carcinomatosis and her gynecological cancer.  The family would like to make the patient comfort care and do not resuscitate. They understand this means no surgery and keeping the patient comfortable.  Hospitalist  admission for comfort care. Family understands patient could pass tonight and will not be able to come until tomorrow.   Curlene Labrum, MD

## 2019-08-05 NOTE — ED Notes (Signed)
PA at bedside updating the pt re: plan of care of comfort care, pt confirms that she is a DNR, pt Zosyn 3.375 grams started at rate of 156mL/hr to infuse over 30 mins per verbal order by PA, [facility] is will bring Vancomycin to bedside, will continue to monitor

## 2019-08-05 DEATH — deceased

## 2020-12-15 IMAGING — CT CT BIOPSY
1 of 4 series · 12 of 32 positions shown, 18 images · non-contrast
Comparison: none

INDICATION: 89-year-old female with gynecological mass and peritoneal
carcinomatosis with omental caking. She presents for CT-guided
biopsy of the same.

[Series 3: i-spiral 5.0 b40f · axial · 0.96mm/px · z∈[+947,+1189]mm · 12 of 83 slices shown, 18 images]
[im 7/83  soft-tissue]
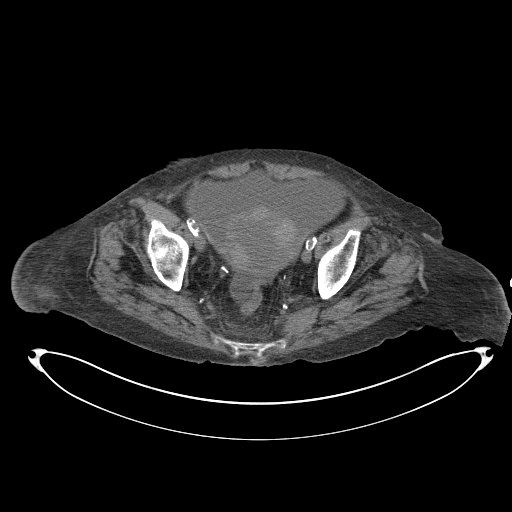
[im 7/83  bone]
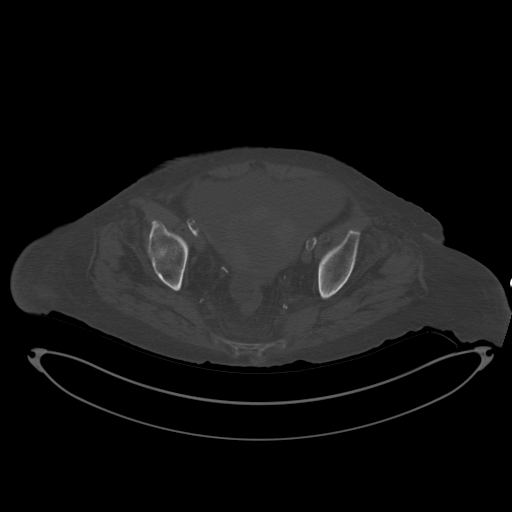
[im 13/83  soft-tissue]
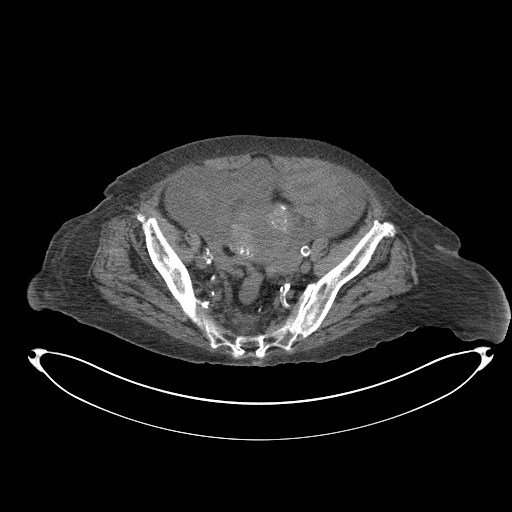
[im 19/83  soft-tissue]
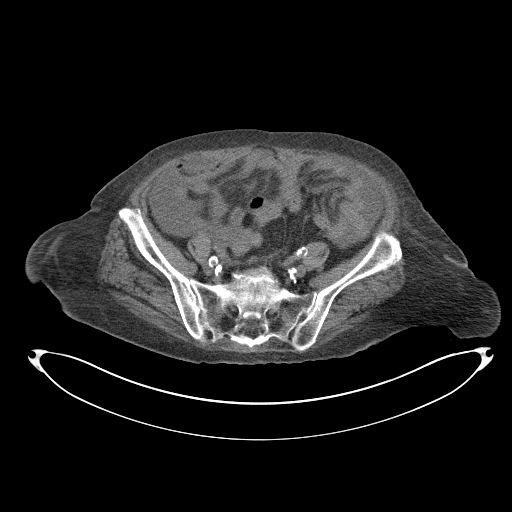
[im 26/83  soft-tissue]
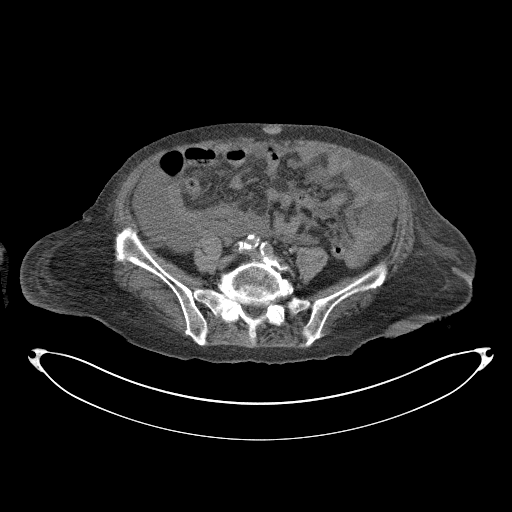
[im 32/83  soft-tissue]
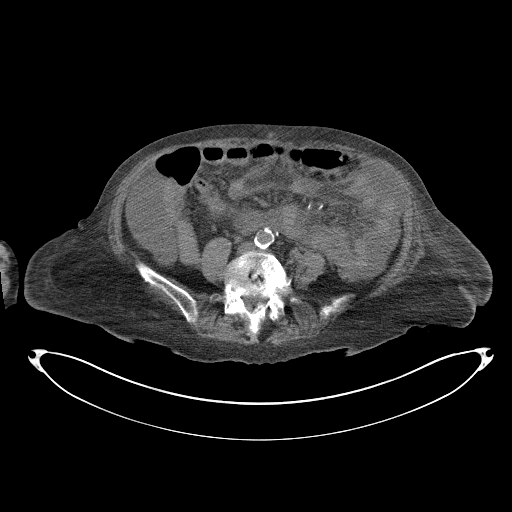
[im 38/83  soft-tissue]
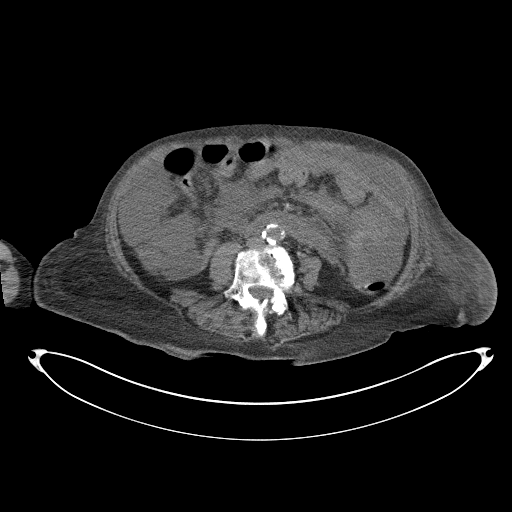
[im 45/83  soft-tissue]
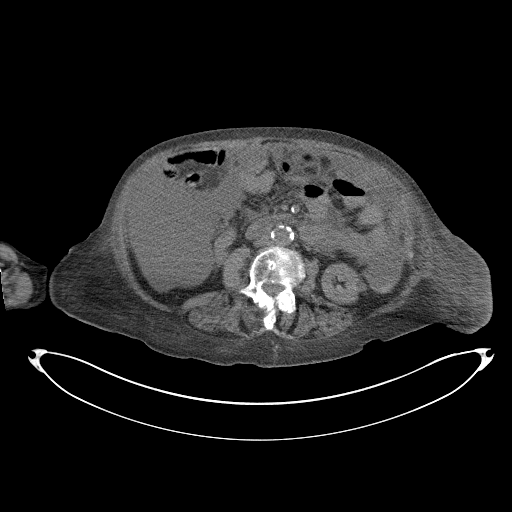
[im 51/83  soft-tissue]
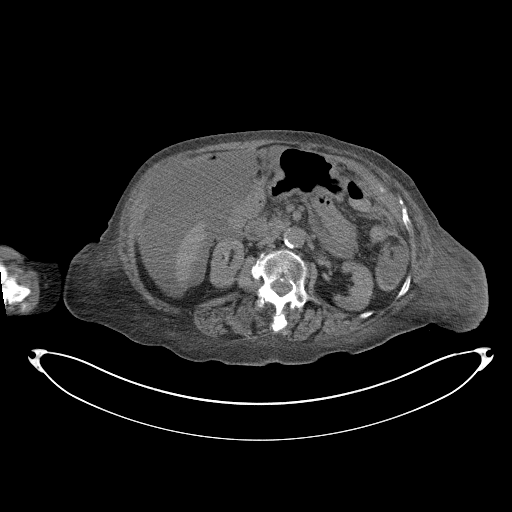
[im 57/83  soft-tissue]
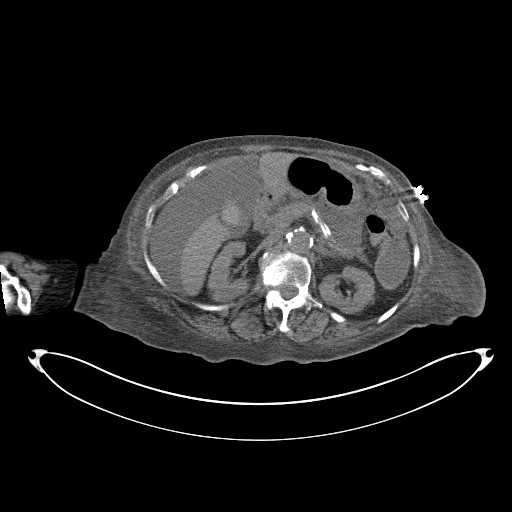
[im 57/83  lung]
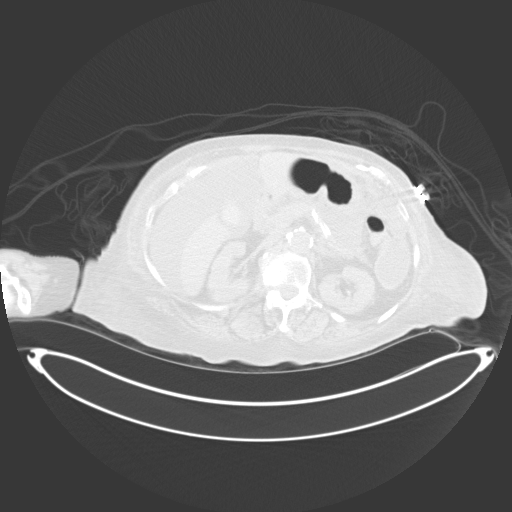
[im 57/83  bone]
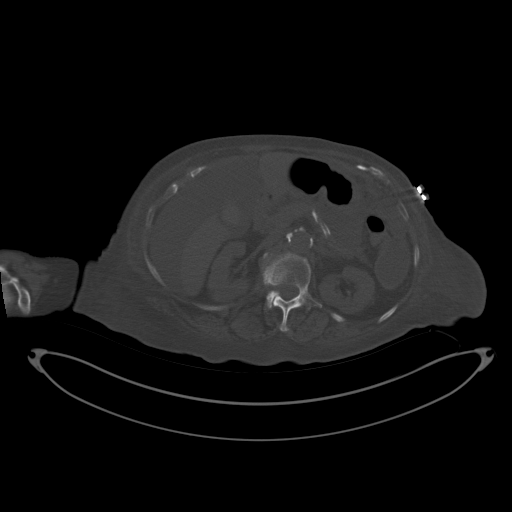
[im 64/83  soft-tissue]
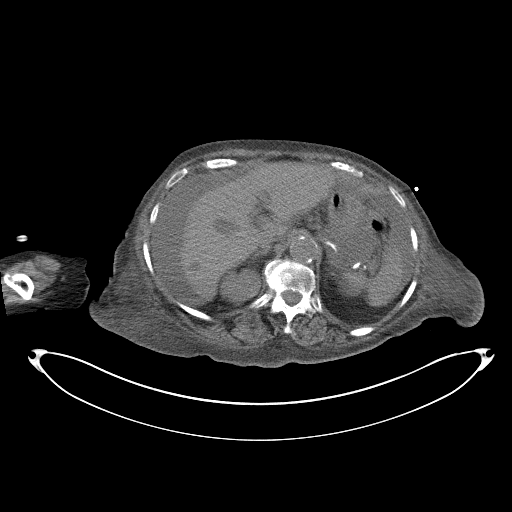
[im 64/83  lung]
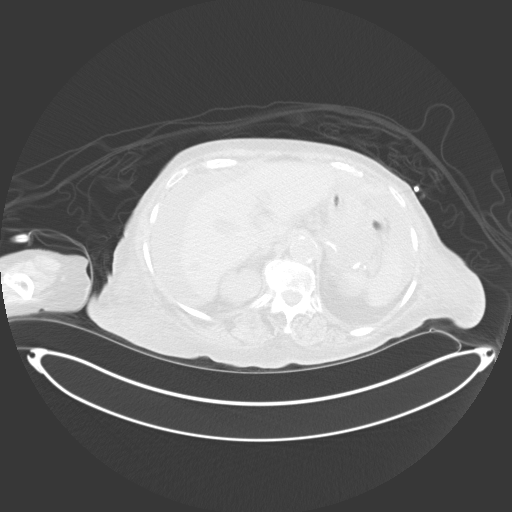
[im 70/83  soft-tissue]
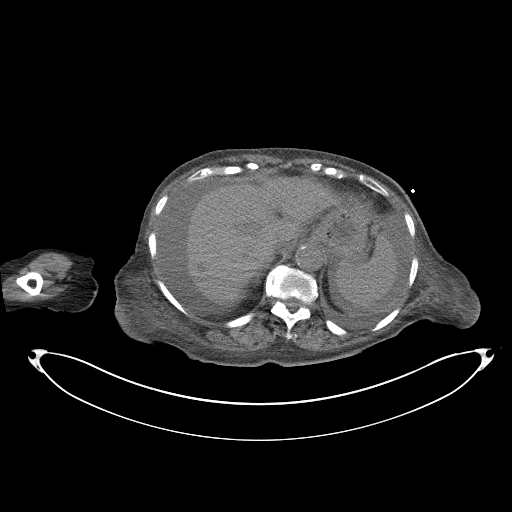
[im 70/83  lung]
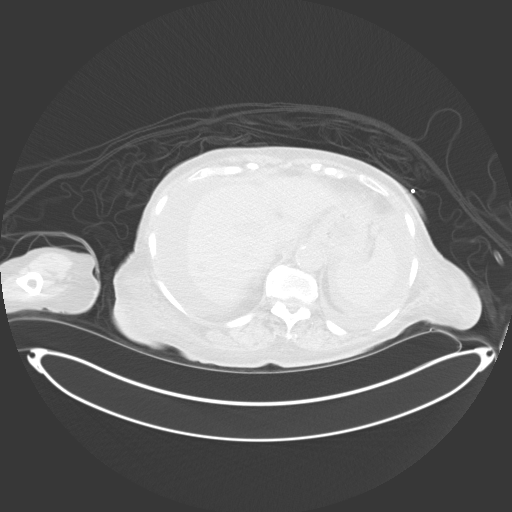
[im 76/83  soft-tissue]
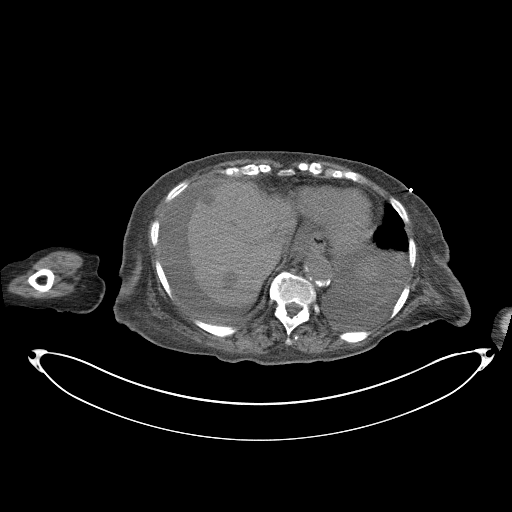
[im 76/83  lung]
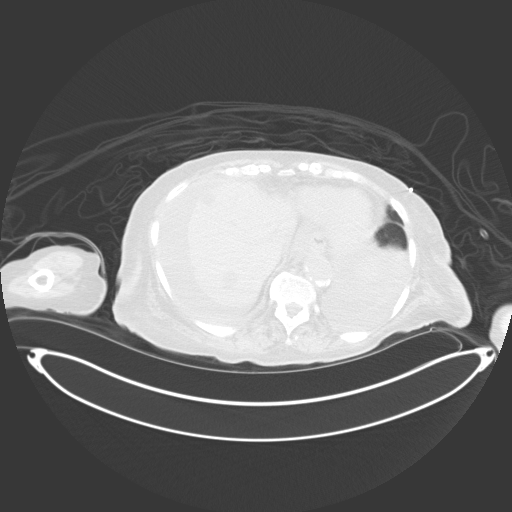

[12 of 32 positions shown; findings below may reference images not displayed]

EXAM:
CT-guided biopsy

MEDICATIONS:
None.

ANESTHESIA/SEDATION:
Moderate (conscious) sedation was employed during this procedure. A
total of Versed 1 mg and Fentanyl 50 mcg was administered
intravenously.

Moderate Sedation Time: 22 minutes. The patient's level of
consciousness and vital signs were monitored continuously by
radiology nursing throughout the procedure under my direct
supervision.

FLUOROSCOPY TIME:  None.

COMPLICATIONS:
None immediate.

PROCEDURE:
Informed written consent was obtained from the patient after a
thorough discussion of the procedural risks, benefits and
alternatives. All questions were addressed. Maximal Sterile Barrier
Technique was utilized including caps, mask, sterile gowns, sterile
gloves, sterile drape, hand hygiene and skin antiseptic. A timeout
was performed prior to the initiation of the procedure.

A planning axial CT scan was performed. A region of omental caking
was identified in the left lower quadrant. A suitable skin entry
site was selected and marked. The overlying skin was sterilely
prepped and draped in the standard fashion using chlorhexidine skin
prep. Local anesthesia was attained by infiltration with 1%
lidocaine. A small dermatotomy was made. Under intermittent CT
guidance, a 17 gauge introducer needle was advanced through the
abdominal wall and positioned at the margin of the mass. Repeat CT
imaging was then performed after administering 50 mL iodinated
contrast material to confirm that the introducer needle was indeed
at the site of omental thickening and that there was no nearby
bowel.

Multiple 18 gauge core biopsies were then obtained coaxially using
the [employer] automated biopsy device. Biopsy specimens were placed
in formalin and delivered to pathology for further analysis.

As the introducer needle was removed, aspiration was used to draw
off some of the ascitic fluid. Approximately 150 mL mildly blood
tinged fluid was successfully aspirated. This was sent for cytology.
IMPRESSION: CT-guided biopsy of omental caking with additional aspiration of
ascitic fluid for cytology.
